# Patient Record
Sex: Female | Born: 1955 | Race: Black or African American | Hispanic: No | Marital: Single | Smoking: Current every day smoker
Health system: Southern US, Community
[De-identification: ages and names within clinical notes are randomized; demographics above are authoritative.]

## PROBLEM LIST (undated history)

## (undated) DIAGNOSIS — I639 Cerebral infarction, unspecified: Secondary | ICD-10-CM

## (undated) DIAGNOSIS — K219 Gastro-esophageal reflux disease without esophagitis: Secondary | ICD-10-CM

## (undated) DIAGNOSIS — I2510 Atherosclerotic heart disease of native coronary artery without angina pectoris: Secondary | ICD-10-CM

## (undated) DIAGNOSIS — I739 Peripheral vascular disease, unspecified: Secondary | ICD-10-CM

## (undated) DIAGNOSIS — I10 Essential (primary) hypertension: Secondary | ICD-10-CM

## (undated) DIAGNOSIS — H5440 Blindness, one eye, unspecified eye: Secondary | ICD-10-CM

## (undated) DIAGNOSIS — E785 Hyperlipidemia, unspecified: Secondary | ICD-10-CM

## (undated) HISTORY — DX: Peripheral vascular disease, unspecified: I73.9

## (undated) HISTORY — DX: Gastro-esophageal reflux disease without esophagitis: K21.9

## (undated) HISTORY — PX: CORONARY ANGIOPLASTY WITH STENT PLACEMENT: SHX49

## (undated) HISTORY — DX: Blindness, one eye, unspecified eye: H54.40

## (undated) HISTORY — DX: Hyperlipidemia, unspecified: E78.5

---

## 1995-04-26 DIAGNOSIS — I639 Cerebral infarction, unspecified: Secondary | ICD-10-CM

## 1995-04-26 HISTORY — DX: Cerebral infarction, unspecified: I63.9

## 2015-11-16 DIAGNOSIS — Z8673 Personal history of transient ischemic attack (TIA), and cerebral infarction without residual deficits: Secondary | ICD-10-CM | POA: Insufficient documentation

## 2015-11-16 DIAGNOSIS — D751 Secondary polycythemia: Secondary | ICD-10-CM | POA: Insufficient documentation

## 2015-11-16 DIAGNOSIS — I169 Hypertensive crisis, unspecified: Secondary | ICD-10-CM | POA: Insufficient documentation

## 2016-01-11 DIAGNOSIS — Z48812 Encounter for surgical aftercare following surgery on the circulatory system: Secondary | ICD-10-CM | POA: Insufficient documentation

## 2016-10-05 DIAGNOSIS — L600 Ingrowing nail: Secondary | ICD-10-CM | POA: Diagnosis not present

## 2017-01-26 DIAGNOSIS — I6523 Occlusion and stenosis of bilateral carotid arteries: Secondary | ICD-10-CM | POA: Insufficient documentation

## 2017-08-09 ENCOUNTER — Ambulatory Visit (INDEPENDENT_AMBULATORY_CARE_PROVIDER_SITE_OTHER): Payer: Self-pay | Admitting: Nurse Practitioner

## 2017-09-20 ENCOUNTER — Ambulatory Visit: Payer: Self-pay | Admitting: Family Medicine

## 2017-09-23 ENCOUNTER — Other Ambulatory Visit: Payer: Self-pay

## 2017-09-23 ENCOUNTER — Encounter (HOSPITAL_BASED_OUTPATIENT_CLINIC_OR_DEPARTMENT_OTHER): Payer: Self-pay | Admitting: Emergency Medicine

## 2017-09-23 ENCOUNTER — Emergency Department (HOSPITAL_BASED_OUTPATIENT_CLINIC_OR_DEPARTMENT_OTHER)
Admission: EM | Admit: 2017-09-23 | Discharge: 2017-09-23 | Disposition: A | Discharge status: Home / Self Care | Payer: Medicaid Other | Attending: Emergency Medicine | Admitting: Emergency Medicine

## 2017-09-23 DIAGNOSIS — Z7902 Long term (current) use of antithrombotics/antiplatelets: Secondary | ICD-10-CM | POA: Diagnosis not present

## 2017-09-23 DIAGNOSIS — F17200 Nicotine dependence, unspecified, uncomplicated: Secondary | ICD-10-CM | POA: Diagnosis not present

## 2017-09-23 DIAGNOSIS — I10 Essential (primary) hypertension: Secondary | ICD-10-CM | POA: Insufficient documentation

## 2017-09-23 DIAGNOSIS — D649 Anemia, unspecified: Secondary | ICD-10-CM | POA: Diagnosis not present

## 2017-09-23 DIAGNOSIS — R1013 Epigastric pain: Secondary | ICD-10-CM | POA: Insufficient documentation

## 2017-09-23 DIAGNOSIS — Z760 Encounter for issue of repeat prescription: Secondary | ICD-10-CM | POA: Insufficient documentation

## 2017-09-23 DIAGNOSIS — I2510 Atherosclerotic heart disease of native coronary artery without angina pectoris: Secondary | ICD-10-CM | POA: Insufficient documentation

## 2017-09-23 DIAGNOSIS — Z955 Presence of coronary angioplasty implant and graft: Secondary | ICD-10-CM | POA: Insufficient documentation

## 2017-09-23 DIAGNOSIS — Z79899 Other long term (current) drug therapy: Secondary | ICD-10-CM | POA: Insufficient documentation

## 2017-09-23 HISTORY — DX: Essential (primary) hypertension: I10

## 2017-09-23 HISTORY — DX: Atherosclerotic heart disease of native coronary artery without angina pectoris: I25.10

## 2017-09-23 HISTORY — DX: Cerebral infarction, unspecified: I63.9

## 2017-09-23 LAB — CBC
HCT: 28.6 % — ABNORMAL LOW (ref 36.0–46.0)
Hemoglobin: 9 g/dL — ABNORMAL LOW (ref 12.0–15.0)
MCH: 20.5 pg — AB (ref 26.0–34.0)
MCHC: 31.5 g/dL (ref 30.0–36.0)
MCV: 65 fL — ABNORMAL LOW (ref 78.0–100.0)
PLATELETS: 382 10*3/uL (ref 150–400)
RBC: 4.4 MIL/uL (ref 3.87–5.11)
RDW: 22.3 % — AB (ref 11.5–15.5)
WBC: 7.3 10*3/uL (ref 4.0–10.5)

## 2017-09-23 MED ORDER — ASPIRIN 81 MG PO CHEW: 81.0000 mg | CHEWABLE_TABLET | Freq: Every day | ORAL | 0 refills | Status: DC

## 2017-09-23 MED ORDER — AMLODIPINE BESYLATE 10 MG PO TABS: 10.0000 mg | ORAL_TABLET | Freq: Every day | ORAL | 0 refills | Status: DC

## 2017-09-23 MED ORDER — ASPIRIN 81 MG PO CHEW: 81.0000 mg | CHEWABLE_TABLET | Freq: Every day | ORAL | 0 refills | Status: AC

## 2017-09-23 MED ORDER — ATORVASTATIN CALCIUM 40 MG PO TABS: 40.0000 mg | ORAL_TABLET | Freq: Every day | ORAL | 0 refills | Status: DC

## 2017-09-23 MED ORDER — PANTOPRAZOLE SODIUM 20 MG PO TBEC: 20.0000 mg | DELAYED_RELEASE_TABLET | Freq: Every day | ORAL | 0 refills | Status: DC

## 2017-09-23 MED ORDER — CLOPIDOGREL BISULFATE 75 MG PO TABS: 75.0000 mg | ORAL_TABLET | Freq: Every day | ORAL | 0 refills | Status: AC

## 2017-09-23 MED ORDER — CLOPIDOGREL BISULFATE 75 MG PO TABS: 75.0000 mg | ORAL_TABLET | Freq: Every day | ORAL | 0 refills | Status: DC

## 2017-09-23 NOTE — ED Triage Notes (Signed)
Patient states that she needs a refill on a medications for her surgery that she had in "2017" - she reports that she had surgery on her nervous systems and her heart. The patient reports that she needs this medication because she hurts in her stomach when she eats, she also was told yesterday at the clinic that she has blood clots and she stopped taking that medications

## 2017-09-23 NOTE — ED Provider Notes (Signed)
[facility] EMERGENCY DEPARTMENT Provider Note   CSN: [id number] Arrival date & time: 09/23/17  1508     History   Chief Complaint Chief Complaint  Patient presents with  . Abdominal Pain    HPI Cindy Hess is a 62 y.o. female.  HPI  Patient is a 62 year old female with a history of coronary artery disease, hypertension, peripheral arterial disease presenting for need for medication refill.  Patient is also noting that she has some epigastric to right upper quadrant pain only after eating.  Patient is not presenting with this complaint at present.  Patient reports that she is moving from [facility] to [facility], and is seeking a new primary care provider.  Her current primary care provider is only able to refill her medications for Labetalol in the process of this move, and patient is unsure why.  At present, patient is denying any chest pain, shortness of breath, dizziness, lightheadedness, headache, visual disturbance, abdominal pain, nausea, vomiting, lower extremity edema or swelling, or increasing pain in the lower extremities.  Patient reports that she has been without clopidogrel for the past 3 months, but all of the medication she has been taking regularly patient denies any history of peptic ulcer disease, or any melena or hematochezia.  Past Medical History:  Diagnosis Date  . Coronary artery disease   . Hypertension   . Stroke [facility])     There are no active problems to display for this patient.   Past Surgical History:  Procedure Laterality Date  . CORONARY ANGIOPLASTY WITH STENT PLACEMENT       OB History   None      Home Medications    Prior to Admission medications   Medication Sig Start Date End Date Taking? Authorizing Provider  amLODipine (NORVASC) 10 MG tablet Take 10 mg by mouth daily.   Yes [provider]  atorvastatin (LIPITOR) 40 MG tablet Take 40 mg by mouth daily.   Yes [provider]    clopidogrel (PLAVIX) 75 MG tablet Take 75 mg by mouth daily.   Yes [provider]  labetalol (NORMODYNE) 200 MG tablet Take 200 mg by mouth 2 (two) times daily.   Yes [provider]  losartan (COZAAR) 100 MG tablet Take 100 mg by mouth daily.   Yes [provider]  pseudoephedrine-acetaminophen (TYLENOL SINUS) 30-500 MG TABS tablet Take 1 tablet by mouth every 4 (four) hours as needed.   Yes [provider]    Family History History reviewed. No pertinent family history.  Social History Social History   Tobacco Use  . Smoking status: Current Every Day Smoker  . Smokeless tobacco: Never Used  Substance Use Topics  . Alcohol use: Not Currently  . Drug use: Not Currently     Allergies   Lisinopril   Review of Systems Review of Systems  Constitutional: Negative for chills and fever.  HENT: Negative for congestion and rhinorrhea.   Respiratory: Negative for chest tightness and shortness of breath.   Cardiovascular: Negative for chest pain, palpitations and leg swelling.  Gastrointestinal: Positive for abdominal pain. Negative for blood in stool, nausea and vomiting.       Abdominal pain only after eating.  Genitourinary: Negative for dysuria.  Musculoskeletal: Negative for back pain, joint swelling and neck stiffness.  Skin: Negative for color change, rash and wound.  Neurological: Negative for dizziness, syncope and light-headedness.  All other systems reviewed and are negative.    Physical Exam Updated Vital  Signs BP (!) 180/90 (BP Location: Left Arm)   Pulse 88   Temp 98.6 F (37 C) (Oral)   Resp 18   Ht 5\' 2"  (1.575 m)   Wt 48.5 kg (107 lb)   SpO2 100%   BMI 19.57 kg/m   Physical Exam  Constitutional: She appears well-developed and well-nourished. No distress.  HENT:  Head: Normocephalic and atraumatic.  Mouth/Throat: Oropharynx is clear and moist.  Eyes: Pupils are equal, round, and reactive to light. Conjunctivae and  EOM are normal.  Neck: Normal range of motion. Neck supple.  Cardiovascular: Normal rate, regular rhythm, S1 normal and S2 normal.  Murmur heard. Pulses:      Radial pulses are 2+ on the right side, and 2+ on the left side.  No lower extremity edema. Loud blowing systolic murmur.  Pulmonary/Chest: Effort normal and breath sounds normal. She has no wheezes. She has no rales.  Abdominal: Soft. She exhibits no distension. There is no tenderness. There is no guarding.  Musculoskeletal: Normal range of motion. She exhibits no edema or deformity.  Scar on medial tibia of left leg.  Neurological: She is alert.  Cranial nerves grossly intact. Patient moves extremities symmetrically and with good coordination.  Skin: Skin is warm and dry. No rash noted. No erythema.  Psychiatric: She has a normal mood and affect. Her behavior is normal. Judgment and thought content normal.  Nursing note and vitals reviewed.    ED Treatments / Results  Labs (all labs ordered are listed, but only abnormal results are displayed) Labs Reviewed  CBC - Abnormal; Notable for the following components:      Result Value   Hemoglobin 9.0 (*)    HCT 28.6 (*)    MCV 65.0 (*)    MCH 20.5 (*)    RDW 22.3 (*)    All other components within normal limits    EKG None  Radiology No results found.  Procedures Procedures (including critical care time)  Medications Ordered in ED Medications - No data to display   Initial Impression / Assessment and Plan / ED Course  I have reviewed the triage vital signs and the nursing notes.  Pertinent labs & imaging results that were available during my care of the patient were reviewed by me and considered in my medical decision making (see chart for details).  Clinical Course as of Sep 24 1650  Sat Sep 23, 2017  1652 Checking CBC to make sure the patient has a normal platelet count.  Provided this, will refill patient's antiplatelet regimen.   [AM]    Clinical Course  User Index [AM] Elisha Ponder, PA-C    Patient is nontoxic-appearing in no acute distress.  Patient with no abdominal pain at present, therefore feel that this can be an outpatient workup.  Differential diagnosis for patient's postprandial epigastric to right upper quadrant pain includes biliary colic versus peptic ulcer disease versus gastritis.  Patient is in no acute distress, has no melena, do not suspect bleeding ulcer.  Given that patient needs to be on dual antiplatelet therapy due to severe peripheral vascular disease, will place patient on proton pump inhibitor.  Will check platelet count before re-prescribing clopidogrel.  Encouraged that patient should follow-up with the resources provided in order to find a primary care provider as soon as possible in [place] given her multiple chronic medical conditions.  Patient exhibits stable anemia.  Normal platelet count.  Therefore, re-dispensed short supply of Plavix.  Patient was also placed  on peptic ulcer prophylaxis while restarting aspirin with Protonix.  This was the recommendation of ED pharmacist, who states that has lesser interactions with Plavix.  Patient was given recommendations for primary care, instructed to follow-up as soon as possible.  Patient was given return precautions for any worsening abdominal pain, dark stools, dizziness, lightheadedness, chest pain, shortness breath.  Patient is in understanding and agrees with plan of care.  Patient reports that her murmur has previously been worked up in primary care.   Final Clinical Impressions(s) / ED Diagnoses   Final diagnoses:  Encounter for medication refill  Postprandial epigastric pain  Anemia, unspecified type    ED Discharge Orders        Ordered    amLODipine (NORVASC) 10 MG tablet  Daily     09/23/17 1729    atorvastatin (LIPITOR) 40 MG tablet  Daily     09/23/17 1729    clopidogrel (PLAVIX) 75 MG tablet  Daily,   Status:  Discontinued     09/23/17 1729     aspirin 81 MG chewable tablet  Daily,   Status:  Discontinued     09/23/17 1729    pantoprazole (PROTONIX) 20 MG tablet  Daily     09/23/17 1729    clopidogrel (PLAVIX) 75 MG tablet  Daily     09/23/17 1841    aspirin 81 MG chewable tablet  Daily     09/23/17 1841       Elisha PonderMurray, Saketh Daubert B, PA-C 09/24/17 0205    Rolan BuccoBelfi, Melanie, MD 09/24/17 1502

## 2017-09-23 NOTE — Discharge Instructions (Signed)
Please see the information and instructions below regarding your visit.  Your diagnoses today include:  1. Encounter for medication refill   2. Postprandial epigastric pain    Your lab work is reassuring today.  Your hemoglobin is per your baseline.  You do have anemia, which will be need to worked up in an outpatient basis.  Tests performed today include: See side panel of your discharge paperwork for testing performed today. Vital signs are listed at the bottom of these instructions.   Medications prescribed:    Take any prescribed medications only as prescribed, and any over the counter medications only as directed on the packaging.  I refilled your medications today.  The only new medication added today as Protonix.  He will take this daily in the morning.  This medication helps protect your stomach from the antiplatelet medications, particularly aspirin.  Home care instructions:  Please follow any educational materials contained in this packet.   Follow-up instructions: Please follow-up with your primary care provider in as soon as possible for further evaluation of your symptoms if they are not completely improved.  Please read the highlighted information in this packet on how to contact [facility] to set up a primary care provider.   Return instructions:  Please return to the Emergency Department if you experience worsening symptoms.  Please return to the emergency department for any chest pain, shortness of breath, feeling that you are going to pass out. Please return to the emergency department if you notice any dizziness or lightheadedness, or dark and tarry stools. Please return to the emergency department if you have any head trauma, as you are on antiplatelet therapy, and this can increase your risk of bleeding. Please return if you have any other emergent concerns.  To find a primary care or specialty doctor please call [phone] or [phone] to access "[facility] Find a Doctor Service."  You may also go on the [facility] website at [email or web address]  There are also multiple [facility], [facility] and [facility] practices throughout the [place] that are frequently accepting new patients. You may find a clinic that is close to your home and contact them.  [facility] [address] [place] [phone]  [facility] in [place] (also locations in [place] and [place]) - [address] [place] [phone]  [facility] - [address] [phone]    Your vital signs today were: BP (!) 147/93 (BP Location: Right Arm)    Pulse 86    Temp 98.6 F (37 C) (Oral)    Resp 18    Ht 5\' 2"  (1.575 m)    Wt 48.5 kg (107 lb)    SpO2 100%    BMI 19.57 kg/m  If your blood pressure (BP) was elevated on multiple readings during this visit above 130 for the top number or above 80 for the bottom number, please have this repeated by your primary care provider within one month. --------------  Thank you for allowing us to participate in your care today.

## 2017-09-25 ENCOUNTER — Telehealth (HOSPITAL_BASED_OUTPATIENT_CLINIC_OR_DEPARTMENT_OTHER): Payer: Self-pay | Admitting: Emergency Medicine

## 2017-09-25 NOTE — Care Management Note (Signed)
Case Management Note  CM consulted for no PCP.  CM noted pt goes to [facility] in [place] and has been established with them for several years.  Elnoria HowardUpdated Murray, PA.  No further CM needs noted at this time.

## 2017-12-20 ENCOUNTER — Encounter: Payer: Self-pay | Admitting: Nurse Practitioner

## 2017-12-20 ENCOUNTER — Ambulatory Visit: Payer: Medicaid Other | Attending: Nurse Practitioner | Admitting: Nurse Practitioner

## 2017-12-20 VITALS — BP 169/99 | HR 84 | Temp 99.1°F | Ht 62.0 in | Wt 103.8 lb

## 2017-12-20 DIAGNOSIS — I2510 Atherosclerotic heart disease of native coronary artery without angina pectoris: Secondary | ICD-10-CM | POA: Diagnosis not present

## 2017-12-20 DIAGNOSIS — Z1231 Encounter for screening mammogram for malignant neoplasm of breast: Secondary | ICD-10-CM | POA: Diagnosis not present

## 2017-12-20 DIAGNOSIS — E785 Hyperlipidemia, unspecified: Secondary | ICD-10-CM | POA: Insufficient documentation

## 2017-12-20 DIAGNOSIS — R739 Hyperglycemia, unspecified: Secondary | ICD-10-CM | POA: Insufficient documentation

## 2017-12-20 DIAGNOSIS — I739 Peripheral vascular disease, unspecified: Secondary | ICD-10-CM | POA: Insufficient documentation

## 2017-12-20 DIAGNOSIS — I10 Essential (primary) hypertension: Secondary | ICD-10-CM | POA: Diagnosis not present

## 2017-12-20 DIAGNOSIS — Z8673 Personal history of transient ischemic attack (TIA), and cerebral infarction without residual deficits: Secondary | ICD-10-CM | POA: Diagnosis not present

## 2017-12-20 DIAGNOSIS — Z955 Presence of coronary angioplasty implant and graft: Secondary | ICD-10-CM | POA: Diagnosis not present

## 2017-12-20 DIAGNOSIS — Z1211 Encounter for screening for malignant neoplasm of colon: Secondary | ICD-10-CM | POA: Diagnosis not present

## 2017-12-20 DIAGNOSIS — R7309 Other abnormal glucose: Secondary | ICD-10-CM | POA: Diagnosis not present

## 2017-12-20 DIAGNOSIS — K279 Peptic ulcer, site unspecified, unspecified as acute or chronic, without hemorrhage or perforation: Secondary | ICD-10-CM | POA: Diagnosis not present

## 2017-12-20 DIAGNOSIS — E782 Mixed hyperlipidemia: Secondary | ICD-10-CM | POA: Diagnosis not present

## 2017-12-20 DIAGNOSIS — K219 Gastro-esophageal reflux disease without esophagitis: Secondary | ICD-10-CM | POA: Insufficient documentation

## 2017-12-20 DIAGNOSIS — R634 Abnormal weight loss: Secondary | ICD-10-CM | POA: Insufficient documentation

## 2017-12-20 MED ORDER — CLOPIDOGREL BISULFATE 75 MG PO TABS: 75.0000 mg | ORAL_TABLET | Freq: Every day | ORAL | 3 refills | Status: AC

## 2017-12-20 MED ORDER — AMLODIPINE BESYLATE 10 MG PO TABS: 10.0000 mg | ORAL_TABLET | Freq: Every day | ORAL | 0 refills | Status: DC

## 2017-12-20 MED ORDER — PANTOPRAZOLE SODIUM 20 MG PO TBEC: 20.0000 mg | DELAYED_RELEASE_TABLET | Freq: Every day | ORAL | 2 refills | Status: AC

## 2017-12-20 MED ORDER — ATORVASTATIN CALCIUM 40 MG PO TABS: 40.0000 mg | ORAL_TABLET | Freq: Every day | ORAL | 1 refills | Status: DC

## 2017-12-20 MED ORDER — METOPROLOL SUCCINATE ER 50 MG PO TB24: 50.0000 mg | ORAL_TABLET | Freq: Every day | ORAL | 1 refills | Status: DC

## 2017-12-20 MED ORDER — ATORVASTATIN CALCIUM 40 MG PO TABS: 40.0000 mg | ORAL_TABLET | Freq: Every day | ORAL | 1 refills | Status: AC

## 2017-12-20 MED ORDER — PANTOPRAZOLE SODIUM 20 MG PO TBEC: 20.0000 mg | DELAYED_RELEASE_TABLET | Freq: Every day | ORAL | 2 refills | Status: DC

## 2017-12-20 MED ORDER — LOSARTAN POTASSIUM 100 MG PO TABS: 100.0000 mg | ORAL_TABLET | Freq: Every day | ORAL | 2 refills | Status: AC

## 2017-12-20 NOTE — Progress Notes (Signed)
Assessment & Plan:  Cindy Hess was seen today for establish care.  Diagnoses and all orders for this visit:  Essential hypertension -     CBC -     CMP14+EGFR -     amLODipine (NORVASC) 10 MG tablet; Take 1 tablet (10 mg total) by mouth daily. -     losartan (COZAAR) 100 MG tablet; Take 1 tablet (100 mg total) by mouth daily. -     metoprolol succinate (TOPROL-XL) 50 MG 24 hr tablet; Take 1 tablet (50 mg total) by mouth daily. Take with or immediately following a meal. Continue all antihypertensives as prescribed.  Remember to bring in your blood pressure log with you for your follow up appointment.  DASH/Mediterranean Diets are healthier choices for HTN.   Weight loss -     TSH  Breast cancer screening by mammogram -     MM 3D SCREEN BREAST BILATERAL; Future  Colon cancer screening -     Fecal occult blood, imunochemical([facility])  PUD (peptic ulcer disease) -     Ambulatory referral to Gastroenterology -     pantoprazole (PROTONIX) 20 MG tablet; Take 1 tablet (20 mg total) by mouth daily. INSTRUCTIONS: Avoid GERD Triggers: acidic, spicy or fried foods, caffeine, coffee, sodas,  alcohol and chocolate.   PAD (peripheral artery disease) (HCC) -     clopidogrel (PLAVIX) 75 MG tablet; Take 1 tablet (75 mg total) by mouth daily. -     Ambulatory referral to Vascular Surgery  Elevated glucose -     Hemoglobin A1c   Mixed hyperlipidemia -     Lipid panel -     atorvastatin (LIPITOR) 40 MG tablet; Take 1 tablet (40 mg total) by mouth daily. INSTRUCTIONS: Work on a low fat, heart healthy diet and participate in regular aerobic exercise program by working out at least 150 minutes per week; 5 days a week-30 minutes per day. Avoid red meat, fried foods. junk foods, sodas, sugary drinks, unhealthy snacking, alcohol and smoking.  Drink at least 48oz of water per day and monitor your carbohydrate intake daily.    Patient has been counseled on age-appropriate routine health  concerns for screening and prevention. These are reviewed and up-to-date. Referrals have been placed accordingly. Immunizations are up-to-date or declined.    Subjective:   Chief Complaint  Patient presents with  . Establish Care    Pt. stated she is here for athritis on her hand and legs. Pt. stated she would like to talk to PCP about gaining her appetite.    HPI Cindy Hess 62 y.o. female presents to office today to establish care. She moved here from [place] 5 months ago.  She has a history of PUD, HPL,  HTN, tobacco dependence, PAD( bilateral iliac artery stenting and endarterectomy with vein patch to the right femoral artery) She would like to establish care here in [place] with GI, vascular surgery and primary care. She has concerns about unintentional weight loss and poor appetite. This has been going on for several months now.    CHRONIC HYPERTENSION Disease Monitoring She has been out of her blood pressure medications.   Blood pressure range BP Readings from Last 3 Encounters:  12/20/17 (!) 169/99  09/23/17 (!) 161/97   Chest pain: no   Dyspnea: no   Claudication: no  Medication compliance: yes; taking amlodipine 10 mg and losartan 100 mg daily. Will add metoprolol succinate 50 mg daily.  Medication Side Effects  Lightheadedness: no   Urinary frequency:  no   Edema: no   Impotence: no  Preventitive Healthcare:  Exercise: no   Diet Pattern: diet: general  Salt Restriction:  no    Hyperlipidemia Patient presents for follow up to hyperlipidemia.  She is medication compliant. She has taken lovastatin in the past. She is diet compliant and denies skin xanthelasma or statin intolerance including myalgias.  Lab Results  Component Value Date   CHOL 188 12/20/2017   Lab Results  Component Value Date   HDL 45 12/20/2017   Lab Results  Component Value Date   LDLCALC 129 (H) 12/20/2017   Lab Results  Component Value Date   TRIG 69 12/20/2017   Lab Results    Component Value Date   CHOLHDL 4.2 12/20/2017   PUD Seen in the ED on 09-23-2017 with complaints of abdominal pain/post prandial epigastric to RUQ pain. It was felt her symptoms could be worked up as OP and DD included biliary colic vs PUD vs gastritis. She was started on a PPI. Today she reports increased improvement of symptoms. She will still need to be evaluated by GI.   Review of Systems  Constitutional: Positive for weight loss (persistent unintentional weight loss). Negative for fever and malaise/fatigue.  HENT: Negative.  Negative for nosebleeds.   Eyes: Negative.  Negative for blurred vision, double vision and photophobia.  Respiratory: Negative.  Negative for cough and shortness of breath.   Cardiovascular: Negative.  Negative for chest pain, palpitations and leg swelling.  Gastrointestinal: Positive for constipation. Negative for heartburn, nausea and vomiting.  Musculoskeletal: Positive for joint pain. Negative for myalgias.       OA  Neurological: Negative.  Negative for dizziness, focal weakness, seizures and headaches.  Psychiatric/Behavioral: Negative.  Negative for suicidal ideas.    Past Medical History:  Diagnosis Date  . Blind left eye   . Coronary artery disease   . GERD (gastroesophageal reflux disease)   . Hyperlipidemia   . Hypertension   . PAD (peripheral artery disease) ([place])   . Stroke [facility]) 1997    Past Surgical History:  Procedure Laterality Date  . CORONARY ANGIOPLASTY WITH STENT PLACEMENT     3 stent    Family History  Problem Relation Age of Onset  . Hypertension Mother     Social History Reviewed with no changes to be made today.   Outpatient Medications Prior to Visit  Medication Sig Dispense Refill  . pseudoephedrine-acetaminophen (TYLENOL SINUS) 30-500 MG TABS tablet Take 1 tablet by mouth every 4 (four) hours as needed.    Marland Kitchen amLODipine (NORVASC) 10 MG tablet Take 1 tablet (10 mg total) by mouth daily. 30 tablet 0  . atorvastatin  (LIPITOR) 40 MG tablet Take 1 tablet (40 mg total) by mouth daily. (Patient not taking: Reported on 12/20/2017) 30 tablet 0  . labetalol (NORMODYNE) 200 MG tablet Take 200 mg by mouth 2 (two) times daily.    Marland Kitchen losartan (COZAAR) 100 MG tablet Take 100 mg by mouth daily.    . pantoprazole (PROTONIX) 20 MG tablet Take 1 tablet (20 mg total) by mouth daily. 30 tablet 0   No facility-administered medications prior to visit.     Allergies  Allergen Reactions  . Lisinopril     Cough        Objective:    BP (!) 169/99 (BP Location: Right Arm, Patient Position: Sitting, Cuff Size: Normal)   Pulse 84   Temp 99.1 F (37.3 C) (Oral)   Ht _0  (1.575 m)  Wt 103 lb 12.8 oz (47.1 kg)   SpO2 100%   BMI 18.99 kg/m  Wt Readings from Last 3 Encounters:  12/20/17 103 lb 12.8 oz (47.1 kg)  09/23/17 107 lb (48.5 kg)    Physical Exam  Constitutional: She is oriented to person, place, and time. She appears well-developed and well-nourished. She is cooperative.  HENT:  Head: Normocephalic and atraumatic.  Eyes: EOM are normal.  Left eye amblyopia and blindness  Neck: Normal range of motion.  Cardiovascular: Normal rate, regular rhythm, normal heart sounds and intact distal pulses. Exam reveals no gallop and no friction rub.  No murmur heard. Pulmonary/Chest: Effort normal and breath sounds normal. No tachypnea. No respiratory distress. She has no decreased breath sounds. She has no wheezes. She has no rhonchi. She has no rales. She exhibits no tenderness.  Abdominal: Soft. Bowel sounds are normal.  Musculoskeletal: Normal range of motion. She exhibits no edema.  Neurological: She is alert and oriented to person, place, and time. Coordination normal.  Skin: Skin is warm and dry.  Psychiatric: She has a normal mood and affect. Her behavior is normal. Judgment and thought content normal.  Nursing note and vitals reviewed.      Patient has been counseled extensively about nutrition and  exercise as well as the importance of adherence with medications and regular follow-up. The patient was given clear instructions to go to ER or return to medical center if symptoms don't improve, worsen or new problems develop. The patient verbalized understanding.   Follow-up: Return in about 4 weeks (around 01/17/2018) for Appetite, OA, BP RECHECK.   Gildardo Pounds, FNP-BC [facility] [place], [place]   12/26/2017, 11:43 PM

## 2017-12-21 ENCOUNTER — Other Ambulatory Visit: Payer: Self-pay | Admitting: Nurse Practitioner

## 2017-12-21 DIAGNOSIS — R7309 Other abnormal glucose: Secondary | ICD-10-CM

## 2017-12-21 LAB — CMP14+EGFR
ALK PHOS: 146 IU/L — AB (ref 39–117)
ALT: 7 IU/L (ref 0–32)
AST: 14 IU/L (ref 0–40)
Albumin/Globulin Ratio: 1.3 (ref 1.2–2.2)
Albumin: 4.6 g/dL (ref 3.6–4.8)
BILIRUBIN TOTAL: 0.6 mg/dL (ref 0.0–1.2)
BUN/Creatinine Ratio: 17 (ref 12–28)
BUN: 12 mg/dL (ref 8–27)
CO2: 21 mmol/L (ref 20–29)
Calcium: 10.4 mg/dL — ABNORMAL HIGH (ref 8.7–10.3)
Chloride: 103 mmol/L (ref 96–106)
Creatinine, Ser: 0.71 mg/dL (ref 0.57–1.00)
GFR calc Af Amer: 106 mL/min/{1.73_m2} (ref >59)
GFR, EST NON AFRICAN AMERICAN: 92 mL/min/{1.73_m2} (ref >59)
GLUCOSE: 125 mg/dL — AB (ref 65–99)
Globulin, Total: 3.5 g/dL (ref 1.5–4.5)
POTASSIUM: 4.2 mmol/L (ref 3.5–5.2)
Sodium: 143 mmol/L (ref 134–144)
TOTAL PROTEIN: 8.1 g/dL (ref 6.0–8.5)

## 2017-12-21 LAB — LIPID PANEL
CHOLESTEROL TOTAL: 188 mg/dL (ref 100–199)
Chol/HDL Ratio: 4.2 ratio (ref 0.0–4.4)
HDL: 45 mg/dL (ref >39)
LDL CALC: 129 mg/dL — AB (ref 0–99)
TRIGLYCERIDES: 69 mg/dL (ref 0–149)
VLDL Cholesterol Cal: 14 mg/dL (ref 5–40)

## 2017-12-21 LAB — CBC
Hematocrit: 30.6 % — ABNORMAL LOW (ref 34.0–46.6)
Hemoglobin: 8.9 g/dL — ABNORMAL LOW (ref 11.1–15.9)
MCH: 20.3 pg — AB (ref 26.6–33.0)
MCHC: 29.1 g/dL — ABNORMAL LOW (ref 31.5–35.7)
MCV: 70 fL — ABNORMAL LOW (ref 79–97)
PLATELETS: 415 10*3/uL (ref 150–450)
RBC: 4.38 x10E6/uL (ref 3.77–5.28)
RDW: 20.1 % — ABNORMAL HIGH (ref 12.3–15.4)
WBC: 8.5 10*3/uL (ref 3.4–10.8)

## 2017-12-21 LAB — TSH: TSH: 0.841 u[IU]/mL (ref 0.450–4.500)

## 2017-12-22 ENCOUNTER — Other Ambulatory Visit: Payer: Self-pay

## 2017-12-22 ENCOUNTER — Telehealth: Payer: Self-pay

## 2017-12-22 DIAGNOSIS — I739 Peripheral vascular disease, unspecified: Secondary | ICD-10-CM

## 2017-12-22 NOTE — Telephone Encounter (Signed)
CMA spoke to patient to inform on lab results. Patient is aware Gastroenterology will call her to set up an appt.   Patient verified DOB. Patient understood.

## 2017-12-22 NOTE — Telephone Encounter (Signed)
-----   Message from Claiborne RiggZelda W Fleming, NP sent at 12/21/2017 11:52 PM EDT ----- Labs continue to show anemia. Please make sure you follow up with gastroenterology when they call you to set up appointment. It is very important that we try to evaluate why your hgb levels are low.

## 2017-12-23 LAB — SPECIMEN STATUS REPORT

## 2017-12-23 LAB — HEMOGLOBIN A1C
Est. average glucose Bld gHb Est-mCnc: 120 mg/dL
HEMOGLOBIN A1C: 5.8 % — AB (ref 4.8–5.6)

## 2017-12-26 ENCOUNTER — Encounter: Payer: Self-pay | Admitting: Nurse Practitioner

## 2017-12-26 MED ORDER — ASPIRIN EC 81 MG PO TBEC: 81.0000 mg | DELAYED_RELEASE_TABLET | Freq: Every day | ORAL | 1 refills | Status: DC

## 2017-12-29 LAB — FECAL OCCULT BLOOD, IMMUNOCHEMICAL: Fecal Occult Bld: NEGATIVE

## 2018-01-01 ENCOUNTER — Encounter: Payer: Self-pay | Admitting: Gastroenterology

## 2018-01-02 ENCOUNTER — Telehealth: Payer: Self-pay

## 2018-01-02 NOTE — Telephone Encounter (Signed)
-----   Message from Claiborne Rigg, NP sent at 12/31/2017  9:45 PM EDT ----- Fecal occult test was negative.

## 2018-01-02 NOTE — Telephone Encounter (Signed)
CMA spoke to patient to inform on results.  Patient verified DOB. Patient understood.  

## 2018-01-17 ENCOUNTER — Ambulatory Visit: Payer: Medicaid Other | Attending: Nurse Practitioner | Admitting: Nurse Practitioner

## 2018-01-17 ENCOUNTER — Encounter: Payer: Self-pay | Admitting: Nurse Practitioner

## 2018-01-17 VITALS — BP 162/84 | HR 65 | Temp 99.0°F | Ht 62.0 in | Wt 105.0 lb

## 2018-01-17 DIAGNOSIS — F17200 Nicotine dependence, unspecified, uncomplicated: Secondary | ICD-10-CM | POA: Diagnosis not present

## 2018-01-17 DIAGNOSIS — Z8249 Family history of ischemic heart disease and other diseases of the circulatory system: Secondary | ICD-10-CM | POA: Insufficient documentation

## 2018-01-17 DIAGNOSIS — I10 Essential (primary) hypertension: Secondary | ICD-10-CM | POA: Diagnosis not present

## 2018-01-17 DIAGNOSIS — Z8673 Personal history of transient ischemic attack (TIA), and cerebral infarction without residual deficits: Secondary | ICD-10-CM | POA: Insufficient documentation

## 2018-01-17 DIAGNOSIS — H5462 Unqualified visual loss, left eye, normal vision right eye: Secondary | ICD-10-CM | POA: Diagnosis not present

## 2018-01-17 DIAGNOSIS — Z79899 Other long term (current) drug therapy: Secondary | ICD-10-CM | POA: Insufficient documentation

## 2018-01-17 DIAGNOSIS — K219 Gastro-esophageal reflux disease without esophagitis: Secondary | ICD-10-CM | POA: Insufficient documentation

## 2018-01-17 DIAGNOSIS — I739 Peripheral vascular disease, unspecified: Secondary | ICD-10-CM | POA: Insufficient documentation

## 2018-01-17 DIAGNOSIS — I2510 Atherosclerotic heart disease of native coronary artery without angina pectoris: Secondary | ICD-10-CM | POA: Insufficient documentation

## 2018-01-17 DIAGNOSIS — Z7902 Long term (current) use of antithrombotics/antiplatelets: Secondary | ICD-10-CM | POA: Insufficient documentation

## 2018-01-17 DIAGNOSIS — Z955 Presence of coronary angioplasty implant and graft: Secondary | ICD-10-CM | POA: Insufficient documentation

## 2018-01-17 DIAGNOSIS — Z888 Allergy status to other drugs, medicaments and biological substances status: Secondary | ICD-10-CM | POA: Insufficient documentation

## 2018-01-17 DIAGNOSIS — E785 Hyperlipidemia, unspecified: Secondary | ICD-10-CM | POA: Diagnosis not present

## 2018-01-17 NOTE — Progress Notes (Signed)
Assessment & Plan:  Cindy Hess was seen today for follow-up.  Diagnoses and all orders for this visit:  Essential hypertension Elevated. F/U 4 weeks for BP recheck.   Patient has been counseled on age-appropriate routine health concerns for screening and prevention. These are reviewed and up-to-date. Referrals have been placed accordingly. Immunizations are up-to-date or declined.    Subjective:   Chief Complaint  Patient presents with  . Follow-up    Pt. is here for follow-up. Pt. stated she is slowly geting her apeptite back. Pt. is here for blood pressure check.    HPI Cindy Hess 62 y.o. female presents to office today for BP recheck. She is not taking her blood pressure medication as prescribed.    Essential Hypertension Blood pressure is not well controlled. She is monitoring her blood pressure at home and reports readings are "high". She is taking some blood pressure medications in the morning and some in the evenings. I have instructed her to take her blood pressure medications in the mornings. Denies chest pain, shortness of breath, palpitations, lightheadedness, dizziness, headaches or BLE edema. Blood pressure is elevated today however she has not taken her losartan yet. She continues to smoke. Will  BP Readings from Last 3 Encounters:  01/17/18 (!) 162/84  12/20/17 (!) 169/99  09/23/17 (!) 161/97    Review of Systems  Constitutional: Negative for fever, malaise/fatigue and weight loss.  HENT: Negative.  Negative for nosebleeds.   Eyes: Negative.  Negative for blurred vision, double vision and photophobia.  Respiratory: Negative.  Negative for cough and shortness of breath.   Cardiovascular: Negative.  Negative for chest pain, palpitations and leg swelling.  Gastrointestinal: Negative.  Negative for heartburn, nausea and vomiting.  Musculoskeletal: Negative.  Negative for myalgias.  Neurological: Negative.  Negative for dizziness, focal weakness, seizures and  headaches.  Psychiatric/Behavioral: Negative.  Negative for suicidal ideas.    Past Medical History:  Diagnosis Date  . Blind left eye   . Coronary artery disease   . GERD (gastroesophageal reflux disease)   . Hyperlipidemia   . Hypertension   . PAD (peripheral artery disease) (HCC)   . Stroke [facility]) 1997    Past Surgical History:  Procedure Laterality Date  . CORONARY ANGIOPLASTY WITH STENT PLACEMENT     3 stent    Family History  Problem Relation Age of Onset  . Hypertension Mother     Social History Reviewed with no changes to be made today.   Outpatient Medications Prior to Visit  Medication Sig Dispense Refill  . amLODipine (NORVASC) 10 MG tablet Take 1 tablet (10 mg total) by mouth daily. 90 tablet 0  . atorvastatin (LIPITOR) 40 MG tablet Take 1 tablet (40 mg total) by mouth daily. 90 tablet 1  . clopidogrel (PLAVIX) 75 MG tablet Take 1 tablet (75 mg total) by mouth daily. 90 tablet 3  . losartan (COZAAR) 100 MG tablet Take 1 tablet (100 mg total) by mouth daily. 90 tablet 2  . metoprolol succinate (TOPROL-XL) 50 MG 24 hr tablet Take 1 tablet (50 mg total) by mouth daily. Take with or immediately following a meal. 30 tablet 1  . pantoprazole (PROTONIX) 20 MG tablet Take 1 tablet (20 mg total) by mouth daily. 90 tablet 2  . aspirin EC 81 MG tablet Take 1 tablet (81 mg total) by mouth daily. (Patient not taking: Reported on 01/17/2018) 90 tablet 1  . pseudoephedrine-acetaminophen (TYLENOL SINUS) 30-500 MG TABS tablet Take 1 tablet by mouth every  4 (four) hours as needed.     No facility-administered medications prior to visit.     Allergies  Allergen Reactions  . Lisinopril     Cough        Objective:    BP (!) 162/84 (BP Location: Right Arm, Patient Position: Sitting, Cuff Size: Normal)   Pulse 65   Temp 99 F (37.2 C) (Oral)   Ht 5\' 2"  (1.575 m)   Wt 105 lb (47.6 kg)   SpO2 100%   BMI 19.20 kg/m  Wt Readings from Last 3 Encounters:  01/17/18 105 lb  (47.6 kg)  12/20/17 103 lb 12.8 oz (47.1 kg)  09/23/17 107 lb (48.5 kg)    Physical Exam  Constitutional: She is oriented to person, place, and time. She appears well-developed and well-nourished. She is cooperative.  HENT:  Head: Normocephalic and atraumatic.  Eyes: EOM are normal.  Neck: Normal range of motion.  Cardiovascular: Normal rate, regular rhythm and normal heart sounds. Exam reveals no gallop and no friction rub.  No murmur heard. Pulmonary/Chest: Effort normal and breath sounds normal. No tachypnea. No respiratory distress. She has no decreased breath sounds. She has no wheezes. She has no rhonchi. She has no rales. She exhibits no tenderness.  Abdominal: Bowel sounds are normal.  Musculoskeletal: Normal range of motion. She exhibits no edema.  Neurological: She is alert and oriented to person, place, and time. Coordination normal.  Skin: Skin is warm and dry.  Psychiatric: She has a normal mood and affect. Her behavior is normal. Judgment and thought content normal.  Nursing note and vitals reviewed.      Patient has been counseled extensively about nutrition and exercise as well as the importance of adherence with medications and regular follow-up. The patient was given clear instructions to go to ER or return to medical center if symptoms don't improve, worsen or new problems develop. The patient verbalized understanding.   Follow-up: Return in about 4 weeks (around 02/14/2018) for PAP and BP recheck .   Claiborne Rigg, FNP-BC [facility] [place], [place] [phone]   01/17/2018, 2:02 PM

## 2018-01-22 ENCOUNTER — Ambulatory Visit: Payer: Medicaid Other

## 2018-02-07 NOTE — Progress Notes (Signed)
Referring Provider: Gildardo Pounds, NP Primary Care Physician:  Gildardo Pounds, NP   Reason for Consultation:  Anemia   IMPRESSION:  Microscopic anemia    - history of endoscopy to evaluate anemia in the 1990s Post-prandial epigastric and right upper quadrant pain     - improved with PPI therapy and discontinuation of NSAIDs FOBT negative Daily Plavix for CAD requiring stent placement Colonoscopy in the 1990s for anemia, no colon cancer screening  Unexplained anemia with recent epigastric and RUQ pain in the setting of NSAID use. Suspected NSAID related PUD, esophagitis, non-erosive reflux disease, H pylori, or gastritis. Must also consider symptomatic gallbladder disease. EGD recommended to evaluate her symptoms. Colonoscopy recommended to evaluate for occult GI blood loss and for colon cancer screening while she is off Plavix.   PLAN: EGD with duodenal biopsies and Colonoscopy if a Plavix washout is approved by her prescribing physician If endoscopy is negative, proceed with laboratory evaluation of anemia +/- referral to hematology  I consented the patient at the bedside today discussing the risks, benefits, and alternatives to endoscopic evaluation. In particular, we discussed the risks that include, but are not limited to, reaction to medication, cardiopulmonary compromise, bleeding requiring blood transfusion, aspiration resulting in pneumonia, perforation requiring surgery, and even death. We reviewed the risk of missed lesion including polyps or even cancer. The patient acknowledges these risks and asks that we proceed.  We also discussed risks and benefits to a Plavix washout prior to proceeding with colonoscopy to for allow for the removal of polyps at the time of his endoscopy.  I discussed the low, but real, risk of a cardiovascular event such as heart attack, stroke, or embolism/thrombosis while off Plavix.    HPI: Cindy Hess is a 62 y.o. female with a history of  coronary artery disease, hypertension, peripheral arterial disease. She is on chronic Plavix after cardiac stents were placed in 2017.  She recently saw her doctor reporting the acute onset of non-radiating, severe, post-prandial epigastric and right upper quadrant pain that has improved with PPI therapy. She had associated severe anorexia and occassional indigestion. These have improved with PPI therapy and a 15 pound weight loss. Some psychosocial stress with recently moving back to [place].   Previously used NSAIDs. But, she was told to stop taking these with her abdominal pain. No menses. No epistaxis, hematuria, hemoptysis. GI ROS is otherwise negative. No other associated features. No identified exacerbating or relieving features.   No overt GI blood loss. No melena, hematochezia, bright red blood per rectum. No epistaxis, vaginal bleeding, hemoptysis, or hematuria.   On Plavix since 2017 since her cardiac stents were placed. Off Plavix for three months when moving to [facility] because she couldn't find a doctor to refill her script. No events occurred during that time.   Labs from December 20, 2017 show a normal CMP except for a glucose of 125, calcium 10.4, and alk phos 146.  Her hemoglobin is 8.9 with an MCV of 70 and an RDW of 20.1.  Labs from September 23, 2017 show a hemoglobin of 9. Negative FOBT 12/20/17. I am unable to locate labs from prior to 2019.  She had endoscopy many, many years ago for anemia. She suspects prior to 1990 at [facility]. No source identified.    Past Medical History:  Diagnosis Date  . Blind left eye   . Coronary artery disease   . GERD (gastroesophageal reflux disease)   . Hyperlipidemia   .  Hypertension   . PAD (peripheral artery disease) ([place])   . Stroke [facility]) 1997    Past Surgical History:  Procedure Laterality Date  . CORONARY ANGIOPLASTY WITH STENT PLACEMENT     3 stent    Prior to Admission medications   Medication Sig Start Date End  Date Taking? Authorizing Provider  amLODipine (NORVASC) 10 MG tablet Take 1 tablet (10 mg total) by mouth daily. 12/20/17 01/19/18  Gildardo Pounds, NP  aspirin EC 81 MG tablet Take 1 tablet (81 mg total) by mouth daily. Patient not taking: Reported on 01/17/2018 12/26/17   Gildardo Pounds, NP  atorvastatin (LIPITOR) 40 MG tablet Take 1 tablet (40 mg total) by mouth daily. 12/20/17 03/20/18  Gildardo Pounds, NP  clopidogrel (PLAVIX) 75 MG tablet Take 1 tablet (75 mg total) by mouth daily. 12/20/17   Gildardo Pounds, NP  losartan (COZAAR) 100 MG tablet Take 1 tablet (100 mg total) by mouth daily. 12/20/17 03/20/18  Gildardo Pounds, NP  metoprolol succinate (TOPROL-XL) 50 MG 24 hr tablet Take 1 tablet (50 mg total) by mouth daily. Take with or immediately following a meal. 12/20/17 01/19/18  Gildardo Pounds, NP  pantoprazole (PROTONIX) 20 MG tablet Take 1 tablet (20 mg total) by mouth daily. 12/20/17 03/20/18  Gildardo Pounds, NP  pseudoephedrine-acetaminophen (TYLENOL SINUS) 30-500 MG TABS tablet Take 1 tablet by mouth every 4 (four) hours as needed.    [provider]    Current Outpatient Medications  Medication Sig Dispense Refill  . amLODipine (NORVASC) 10 MG tablet Take 1 tablet (10 mg total) by mouth daily. 90 tablet 0  . aspirin EC 81 MG tablet Take 1 tablet (81 mg total) by mouth daily. (Patient not taking: Reported on 01/17/2018) 90 tablet 1  . atorvastatin (LIPITOR) 40 MG tablet Take 1 tablet (40 mg total) by mouth daily. 90 tablet 1  . clopidogrel (PLAVIX) 75 MG tablet Take 1 tablet (75 mg total) by mouth daily. 90 tablet 3  . losartan (COZAAR) 100 MG tablet Take 1 tablet (100 mg total) by mouth daily. 90 tablet 2  . metoprolol succinate (TOPROL-XL) 50 MG 24 hr tablet Take 1 tablet (50 mg total) by mouth daily. Take with or immediately following a meal. 30 tablet 1  . pantoprazole (PROTONIX) 20 MG tablet Take 1 tablet (20 mg total) by mouth daily. 90 tablet 2  .  pseudoephedrine-acetaminophen (TYLENOL SINUS) 30-500 MG TABS tablet Take 1 tablet by mouth every 4 (four) hours as needed.     No current facility-administered medications for this visit.     Allergies as of 02/08/2018 - Review Complete 01/17/2018  Allergen Reaction Noted  . Lisinopril  09/23/2017    Family History  Problem Relation Age of Onset  . Hypertension Mother     Social History   Socioeconomic History  . Marital status: Single    Spouse name: Not on file  . Number of children: Not on file  . Years of education: Not on file  . Highest education level: Not on file  Occupational History  . Not on file  Social Needs  . Financial resource strain: Not on file  . Food insecurity:    Worry: Not on file    Inability: Not on file  . Transportation needs:    Medical: Not on file    Non-medical: Not on file  Tobacco Use  . Smoking status: Current Every Day Smoker  . Smokeless tobacco: Never Used  Substance and Sexual Activity  . Alcohol use: Yes    Comment: socially   . Drug use: Not Currently  . Sexual activity: Not Currently  Lifestyle  . Physical activity:    Days per week: Not on file    Minutes per session: Not on file  . Stress: Not on file  Relationships  . Social connections:    Talks on phone: Not on file    Gets together: Not on file    Attends religious service: Not on file    Active member of club or organization: Not on file    Attends meetings of clubs or organizations: Not on file    Relationship status: Not on file  . Intimate partner violence:    Fear of current or ex partner: Not on file    Emotionally abused: Not on file    Physically abused: Not on file    Forced sexual activity: Not on file  Other Topics Concern  . Not on file  Social History Narrative  . Not on file    Review of Systems: 12 system ROS is negative except as noted above.   Physical Exam: Vital signs reviewed.  General:   Alert, well-nourished, pleasant and  cooperative in NAD. Slightly anxious. Head:  Normocephalic and atraumatic. Eyes:  Sclera clear, no icterus.   Conjunctiva pink. Mouth:  No deformity or lesions.   Neck:  Supple; no thyromegaly. Lungs:  Clear throughout to auscultation.   No wheezes.  Heart:  Regular rate and rhythm; no murmurs Abdomen:  Soft, thin, nontender, normal bowel sounds. No rebound or guarding. No hepatosplenomegaly Rectal:  Deferred  Msk:  Symmetrical without gross deformities. Extremities:  No gross deformities or edema. Neurologic:  Alert and  oriented x4;  grossly nonfocal Skin:  No rash or bruise. Psych:  Alert and cooperative. Normal mood and affect.   Antoinette Borgwardt L. Tarri Glenn Md, MPH [facility] 02/07/2018, 11:49 AM

## 2018-02-08 ENCOUNTER — Ambulatory Visit (INDEPENDENT_AMBULATORY_CARE_PROVIDER_SITE_OTHER): Payer: Medicaid Other | Admitting: Gastroenterology

## 2018-02-08 ENCOUNTER — Encounter (INDEPENDENT_AMBULATORY_CARE_PROVIDER_SITE_OTHER): Payer: Self-pay

## 2018-02-08 ENCOUNTER — Encounter: Payer: Self-pay | Admitting: Gastroenterology

## 2018-02-08 ENCOUNTER — Telehealth: Payer: Self-pay | Admitting: Emergency Medicine

## 2018-02-08 VITALS — BP 168/78 | HR 66 | Ht 65.0 in | Wt 106.0 lb

## 2018-02-08 DIAGNOSIS — R1013 Epigastric pain: Secondary | ICD-10-CM

## 2018-02-08 DIAGNOSIS — R1011 Right upper quadrant pain: Secondary | ICD-10-CM

## 2018-02-08 DIAGNOSIS — D509 Iron deficiency anemia, unspecified: Secondary | ICD-10-CM | POA: Diagnosis not present

## 2018-02-08 NOTE — Patient Instructions (Signed)

## 2018-02-08 NOTE — Telephone Encounter (Signed)
   Cindy Hess December 30, 1955 [id number]  Dear Dr. Meredeth Ide:  We have scheduled the above named patient for a(n) endoscopy and colonoscopy procedure. Our records show that (s)he is on anticoagulation therapy.  Please advise as to whether the patient may come off their therapy of Plavix  5 days prior to their procedure which is scheduled for 02-19-18.  Please route your response to Deneise Lever, CMA or fax response to [phone].  Sincerely,    [facility]

## 2018-02-11 NOTE — Telephone Encounter (Signed)
She can stop her plavix 72 hours prior to the procedure and resume 48-72 hours after the procedure if no bleeding complications.

## 2018-02-12 NOTE — Telephone Encounter (Signed)
Thanks for your input. I will have limited therapeutic options if we are unable to hold the Plavix for 5 days prior to endoscopy. I wanted to confirm that this is not an option. Thank you.

## 2018-02-13 ENCOUNTER — Ambulatory Visit (INDEPENDENT_AMBULATORY_CARE_PROVIDER_SITE_OTHER): Payer: Medicaid Other | Admitting: Vascular Surgery

## 2018-02-13 ENCOUNTER — Encounter (HOSPITAL_COMMUNITY): Payer: Medicaid Other

## 2018-02-13 ENCOUNTER — Encounter: Payer: Self-pay | Admitting: Vascular Surgery

## 2018-02-13 DIAGNOSIS — I739 Peripheral vascular disease, unspecified: Secondary | ICD-10-CM | POA: Diagnosis not present

## 2018-02-13 NOTE — Progress Notes (Signed)
Patient name: Cindy Hess MRN: [id number] DOB: Jul 25, 1955 Sex: female  REASON FOR CONSULT: PAD  HPI: Cindy Hess is a 62 y.o. female, with history of hypertension hyperlipidemia tobacco abuse, PAD that presents for new provider evaluation of her PAD.  Turns out the patient has had extensive vascular surgery procedures done at [facility] in [facility].  Patient states she recently moved here and now wants to establish care with a vascular surgeon in [place].  In reviewing her records and care everywhere appears that in 2017 she had a right common femoral endarterectomy as well as retrograde right common and external iliac stent and also had a left common and external iliac stent.  Patient reports she is been followed by Dr. Janee Morn and he was also following her for carotid disease in addition to her peripheral vascular disease.  On evaluation today she states she had some increased numbness and pain in both of her feet.  Her last follow-up with her vascular surgeon was in January of this year according to the patient.  Past Medical History:  Diagnosis Date  . Blind left eye   . Coronary artery disease   . GERD (gastroesophageal reflux disease)   . Hyperlipidemia   . Hypertension   . PAD (peripheral artery disease) (HCC)   . Stroke [facility]) 1997    Past Surgical History:  Procedure Laterality Date  . CORONARY ANGIOPLASTY WITH STENT PLACEMENT     3 stent    Family History  Problem Relation Age of Onset  . Hypertension Mother     SOCIAL HISTORY: Social History   Socioeconomic History  . Marital status: Single    Spouse name: Not on file  . Number of children: Not on file  . Years of education: Not on file  . Highest education level: Not on file  Occupational History  . Not on file  Social Needs  . Financial resource strain: Not on file  . Food insecurity:    Worry: Not on file    Inability: Not on file  . Transportation needs:    Medical: Not on file   Non-medical: Not on file  Tobacco Use  . Smoking status: Current Every Day Smoker    Packs/day: 0.25    Years: 3.00    Pack years: 0.75    Types: Cigarettes  . Smokeless tobacco: Never Used  Substance and Sexual Activity  . Alcohol use: Yes    Comment: socially   . Drug use: Not Currently  . Sexual activity: Not Currently  Lifestyle  . Physical activity:    Days per week: Not on file    Minutes per session: Not on file  . Stress: Not on file  Relationships  . Social connections:    Talks on phone: Not on file    Gets together: Not on file    Attends religious service: Not on file    Active member of club or organization: Not on file    Attends meetings of clubs or organizations: Not on file    Relationship status: Not on file  . Intimate partner violence:    Fear of current or ex partner: Not on file    Emotionally abused: Not on file    Physically abused: Not on file    Forced sexual activity: Not on file  Other Topics Concern  . Not on file  Social History Narrative  . Not on file    Allergies  Allergen Reactions  . Lisinopril  Cough   . Lovastatin Cough    Current Outpatient Medications  Medication Sig Dispense Refill  . amLODipine (NORVASC) 10 MG tablet Take 1 tablet (10 mg total) by mouth daily. 90 tablet 0  . atorvastatin (LIPITOR) 40 MG tablet Take 1 tablet (40 mg total) by mouth daily. 90 tablet 1  . calcium-vitamin D 250-100 MG-UNIT tablet Take by mouth.    . clopidogrel (PLAVIX) 75 MG tablet Take 1 tablet (75 mg total) by mouth daily. 90 tablet 3  . losartan (COZAAR) 100 MG tablet Take 1 tablet (100 mg total) by mouth daily. 90 tablet 2  . metoprolol succinate (TOPROL-XL) 50 MG 24 hr tablet Take 1 tablet (50 mg total) by mouth daily. Take with or immediately following a meal. 30 tablet 1  . pantoprazole (PROTONIX) 20 MG tablet Take 1 tablet (20 mg total) by mouth daily. 90 tablet 2   No current facility-administered medications for this visit.      REVIEW OF SYSTEMS:  [X]  denotes positive finding, [ ]  denotes negative finding Cardiac  Comments:  Chest pain or chest pressure:    Shortness of breath upon exertion:    Short of breath when lying flat:    Irregular heart rhythm:        Vascular    Pain in calf, thigh, or hip brought on by ambulation: x   Pain in feet at night that wakes you up from your sleep:  x   Blood clot in your veins:    Leg swelling:         Pulmonary    Oxygen at home:    Productive cough:     Wheezing:         Neurologic    Sudden weakness in arms or legs:     Sudden numbness in arms or legs:     Sudden onset of difficulty speaking or slurred speech:    Temporary loss of vision in one eye:     Problems with dizziness:         Gastrointestinal    Blood in stool:     Vomited blood:         Genitourinary    Burning when urinating:     Blood in urine:        Psychiatric    Major depression:         Hematologic    Bleeding problems:    Problems with blood clotting too easily:        Skin    Rashes or ulcers:        Constitutional    Fever or chills:      PHYSICAL EXAM: Vitals:   02/13/18 1407  BP: (!) 148/84  Pulse: 66  Resp: 16  Temp: 98.8 F (37.1 C)  TempSrc: Oral  SpO2: 100%  Weight: 48.5 kg  Height: 5\' 2"  (1.575 m)    GENERAL: The patient is a well-nourished female, in no acute distress. The vital signs are documented above. CARDIAC: There is a regular rate and rhythm.  VASCULAR:  Well healed right vertical groin incision 2+ palpable bilateral femoral pulses 1+ palpable bilateral DP pulses bilateral lower extremities No tissue loss in either lower extremity PULMONARY: There is good air exchange bilaterally without wheezing or rales. ABDOMEN: Soft and non-tender with normal pitched bowel sounds.  MUSCULOSKELETAL: There are no major deformities or cyanosis. NEUROLOGIC: No focal weakness or paresthesias are detected. SKIN: There are no ulcers or rashes  noted. PSYCHIATRIC: The  patient has a normal affect.  DATA:   No imaging to review  Assessment/Plan:  61 year old female with history of severe PAD that has undergone bilateral common and external iliac stenting as well as a right common femoral endarterectomy at [facility].  She presents today to establish care here with a vascular surgeon in [place].  On exam she has palpable dorsalis pedis pulses with no evidence of tissue loss and some subjective numbness in both feet.  Her previous right groin incision is well-healed.  She has palpable bilateral femoral pulses as well.  Given that she is new to our practice I recommended bilateral aortoiliac duplex/ABI's to evaluate her iliac stents as well as a carotid duplex since she was under surveillance for carotid disease.  I will contact the patient with the results of her studies and if we need to see her sooner for an intervention we can.  Otherwise recommended a one-year follow-up.  Her last ABI's were 1.04 on the right and 0.84 on the left per chart review.    Cephus Shelling, MD [facility] [place] Office: [phone] Pager: [phone]    Cephus Shelling

## 2018-02-14 ENCOUNTER — Ambulatory Visit: Payer: Medicaid Other | Attending: Nurse Practitioner | Admitting: Nurse Practitioner

## 2018-02-14 ENCOUNTER — Other Ambulatory Visit: Payer: Self-pay

## 2018-02-14 ENCOUNTER — Other Ambulatory Visit (HOSPITAL_COMMUNITY)
Admission: RE | Admit: 2018-02-14 | Discharge: 2018-02-14 | Disposition: A | Discharge status: Home / Self Care | Payer: Medicaid Other | Source: Ambulatory Visit | Attending: Nurse Practitioner | Admitting: Nurse Practitioner

## 2018-02-14 ENCOUNTER — Encounter: Payer: Self-pay | Admitting: Nurse Practitioner

## 2018-02-14 VITALS — BP 143/82 | HR 65 | Temp 98.6°F | Ht 62.0 in | Wt 106.4 lb

## 2018-02-14 DIAGNOSIS — Z01419 Encounter for gynecological examination (general) (routine) without abnormal findings: Secondary | ICD-10-CM | POA: Diagnosis not present

## 2018-02-14 DIAGNOSIS — Z888 Allergy status to other drugs, medicaments and biological substances status: Secondary | ICD-10-CM | POA: Insufficient documentation

## 2018-02-14 DIAGNOSIS — Z124 Encounter for screening for malignant neoplasm of cervix: Secondary | ICD-10-CM | POA: Diagnosis not present

## 2018-02-14 DIAGNOSIS — I10 Essential (primary) hypertension: Secondary | ICD-10-CM

## 2018-02-14 DIAGNOSIS — Z79899 Other long term (current) drug therapy: Secondary | ICD-10-CM | POA: Diagnosis not present

## 2018-02-14 DIAGNOSIS — Z8673 Personal history of transient ischemic attack (TIA), and cerebral infarction without residual deficits: Secondary | ICD-10-CM | POA: Insufficient documentation

## 2018-02-14 DIAGNOSIS — K219 Gastro-esophageal reflux disease without esophagitis: Secondary | ICD-10-CM | POA: Diagnosis not present

## 2018-02-14 DIAGNOSIS — I739 Peripheral vascular disease, unspecified: Secondary | ICD-10-CM

## 2018-02-14 DIAGNOSIS — Z8249 Family history of ischemic heart disease and other diseases of the circulatory system: Secondary | ICD-10-CM | POA: Diagnosis not present

## 2018-02-14 DIAGNOSIS — E785 Hyperlipidemia, unspecified: Secondary | ICD-10-CM | POA: Insufficient documentation

## 2018-02-14 DIAGNOSIS — Z955 Presence of coronary angioplasty implant and graft: Secondary | ICD-10-CM | POA: Insufficient documentation

## 2018-02-14 DIAGNOSIS — I2510 Atherosclerotic heart disease of native coronary artery without angina pectoris: Secondary | ICD-10-CM | POA: Insufficient documentation

## 2018-02-14 MED ORDER — AMLODIPINE BESYLATE 10 MG PO TABS: 10.0000 mg | ORAL_TABLET | Freq: Every day | ORAL | 0 refills | Status: DC

## 2018-02-14 MED ORDER — METOPROLOL SUCCINATE ER 50 MG PO TB24: 50.0000 mg | ORAL_TABLET | Freq: Every day | ORAL | 1 refills | Status: AC

## 2018-02-14 NOTE — Patient Instructions (Signed)

## 2018-02-14 NOTE — Telephone Encounter (Signed)
Patient canceled her appointment. She states "there is nothing wrong with my bowels and I don't need to have this done."

## 2018-02-14 NOTE — Progress Notes (Signed)
Assessment & Plan:  Cindy Hess was seen today for gynecologic exam.  Diagnoses and all orders for this visit:  Encounter for Papanicolaou smear for cervical cancer screening -     Cytology - PAP  Essential hypertension -     metoprolol succinate (TOPROL-XL) 50 MG 24 hr tablet; Take 1 tablet (50 mg total) by mouth daily. Take with or immediately following a meal. -     amLODipine (NORVASC) 10 MG tablet; Take 1 tablet (10 mg total) by mouth daily. Blood pressure elevated today however she is out of metoprolol and amlodipine. Will recheck in a few weeks after she picks her medications up from the pharmacy. She denies chest pain, shortness of breath, palpitations, lightheadedness, dizziness, headaches or BLE edema.  BP Readings from Last 3 Encounters:  02/14/18 (!) 143/82  02/13/18 (!) 148/84  02/08/18 (!) 168/78   Continue all antihypertensives as prescribed.  Remember to bring in your blood pressure log with you for your follow up appointment.  DASH/Mediterranean Diets are healthier choices for HTN.      Patient has been counseled on age-appropriate routine health concerns for screening and prevention. These are reviewed and up-to-date. Referrals have been placed accordingly. Immunizations are up-to-date or declined.    Subjective:   Chief Complaint  Patient presents with  . Gynecologic Exam    Pt. is here for pap smear and BP.    HPI Cindy Hess 62 y.o. female presents to office today   Review of Systems  Constitutional: Negative.  Negative for chills, fever, malaise/fatigue and weight loss.  Respiratory: Negative.  Negative for cough, shortness of breath and wheezing.   Cardiovascular: Negative.  Negative for chest pain, orthopnea and leg swelling.  Gastrointestinal: Negative for abdominal pain.  Genitourinary: Negative.  Negative for flank pain.  Skin: Negative.  Negative for rash.  Psychiatric/Behavioral: Negative for suicidal ideas.    Past Medical History:    Diagnosis Date  . Blind left eye   . Coronary artery disease   . GERD (gastroesophageal reflux disease)   . Hyperlipidemia   . Hypertension   . PAD (peripheral artery disease) (HCC)   . Stroke [facility]) 1997    Past Surgical History:  Procedure Laterality Date  . CORONARY ANGIOPLASTY WITH STENT PLACEMENT     3 stent    Family History  Problem Relation Age of Onset  . Hypertension Mother     Social History Reviewed with no changes to be made today.   Outpatient Medications Prior to Visit  Medication Sig Dispense Refill  . atorvastatin (LIPITOR) 40 MG tablet Take 1 tablet (40 mg total) by mouth daily. 90 tablet 1  . calcium-vitamin D 250-100 MG-UNIT tablet Take by mouth.    . Cholecalciferol (VITAMIN D PO) Take 1,000 Units by mouth.    . clopidogrel (PLAVIX) 75 MG tablet Take 1 tablet (75 mg total) by mouth daily. 90 tablet 3  . losartan (COZAAR) 100 MG tablet Take 1 tablet (100 mg total) by mouth daily. 90 tablet 2  . pantoprazole (PROTONIX) 20 MG tablet Take 1 tablet (20 mg total) by mouth daily. 90 tablet 2  . amLODipine (NORVASC) 10 MG tablet Take 1 tablet (10 mg total) by mouth daily. 90 tablet 0  . metoprolol succinate (TOPROL-XL) 50 MG 24 hr tablet Take 1 tablet (50 mg total) by mouth daily. Take with or immediately following a meal. 30 tablet 1   No facility-administered medications prior to visit.     Allergies  Allergen  Reactions  . Lisinopril     Cough   . Lovastatin Cough       Objective:    BP (!) 143/82 (BP Location: Left Arm, Patient Position: Sitting, Cuff Size: Normal)   Pulse 65   Temp 98.6 F (37 C) (Oral)   Ht 5\' 2"  (1.575 m)   Wt 106 lb 6.4 oz (48.3 kg)   SpO2 99%   BMI 19.46 kg/m  Wt Readings from Last 3 Encounters:  02/14/18 106 lb 6.4 oz (48.3 kg)  02/13/18 107 lb (48.5 kg)  02/08/18 106 lb (48.1 kg)    Physical Exam  Constitutional: She is oriented to person, place, and time. She appears well-developed and well-nourished.  HENT:   Head: Normocephalic.  Cardiovascular: Normal rate, regular rhythm and normal heart sounds.  Pulmonary/Chest: Effort normal and breath sounds normal.  Abdominal: Soft. Bowel sounds are normal. Hernia confirmed negative in the right inguinal area and confirmed negative in the left inguinal area.  Genitourinary: Rectum normal, vagina normal and uterus normal. Rectal exam shows no external hemorrhoid. No labial fusion. There is no rash, tenderness, lesion or injury on the right labia. There is no rash, tenderness, lesion or injury on the left labia. Uterus is not deviated and not enlarged. Cervix exhibits no motion tenderness and no friability. Right adnexum displays no mass, no tenderness and no fullness. Left adnexum displays no mass, no tenderness and no fullness. No erythema, tenderness or bleeding in the vagina. No foreign body in the vagina. No signs of injury around the vagina. No vaginal discharge found.  Lymphadenopathy: No inguinal adenopathy noted on the right or left side.       Right: No inguinal adenopathy present.       Left: No inguinal adenopathy present.  Neurological: She is alert and oriented to person, place, and time.  Skin: Skin is warm and dry.  Psychiatric: She has a normal mood and affect. Her behavior is normal. Judgment and thought content normal.       Patient has been counseled extensively about nutrition and exercise as well as the importance of adherence with medications and regular follow-up. The patient was given clear instructions to go to ER or return to medical center if symptoms don't improve, worsen or new problems develop. The patient verbalized understanding.   Follow-up: Return in about 4 weeks (around 03/14/2018) for BP recheck.   Claiborne Rigg, FNP-BC [facility] [place], [place] [phone]   02/14/2018, 3:02 PM

## 2018-02-15 NOTE — Telephone Encounter (Signed)
Thank you so much

## 2018-02-19 ENCOUNTER — Encounter: Payer: Medicaid Other | Admitting: Gastroenterology

## 2018-02-20 ENCOUNTER — Ambulatory Visit: Payer: Medicaid Other

## 2018-02-20 LAB — CYTOLOGY - PAP
Bacterial vaginitis: NEGATIVE
CANDIDA VAGINITIS: NEGATIVE
Chlamydia: NEGATIVE
Diagnosis: NEGATIVE
HPV (WINDOPATH): NOT DETECTED
Neisseria Gonorrhea: NEGATIVE
Trichomonas: NEGATIVE

## 2018-02-21 ENCOUNTER — Encounter (HOSPITAL_COMMUNITY): Payer: Medicaid Other

## 2018-02-22 ENCOUNTER — Encounter (HOSPITAL_COMMUNITY): Payer: Medicaid Other

## 2018-02-26 ENCOUNTER — Telehealth: Payer: Self-pay

## 2018-02-26 NOTE — Telephone Encounter (Signed)
-----   Message from Claiborne Rigg, NP sent at 02/22/2018  9:19 PM EDT ----- PAP smear is normal.

## 2018-02-26 NOTE — Telephone Encounter (Signed)
CMA spoke to patient to inform on results.  Pt. verfiied DOB. Pt. Understood.

## 2018-02-28 ENCOUNTER — Ambulatory Visit (HOSPITAL_COMMUNITY)
Admission: RE | Admit: 2018-02-28 | Discharge: 2018-02-28 | Disposition: A | Discharge status: Home / Self Care | Payer: Medicaid Other | Source: Ambulatory Visit | Attending: Vascular Surgery | Admitting: Vascular Surgery

## 2018-02-28 DIAGNOSIS — I739 Peripheral vascular disease, unspecified: Secondary | ICD-10-CM | POA: Insufficient documentation

## 2018-03-06 ENCOUNTER — Ambulatory Visit (HOSPITAL_COMMUNITY): Payer: Medicaid Other

## 2018-03-06 ENCOUNTER — Ambulatory Visit (HOSPITAL_COMMUNITY): Admission: RE | Admit: 2018-03-06 | Payer: Medicaid Other | Source: Ambulatory Visit

## 2018-03-14 ENCOUNTER — Encounter: Payer: Medicaid Other | Admitting: Pharmacist

## 2018-03-14 NOTE — Progress Notes (Deleted)
   S:    Patient arrives ***.    Presents to the clinic for hypertension evaluation, counseling, and management. Patient was referred by Dr. Marland Kitchen*** on ***.  Patient was last seen by Primary Care Provider on ***. Last Rx Clinic visit *** - at that time ***.  Patient {Actions; denies-reports:120008} adherence with medications.  Current BP Medications include:  ***  Antihypertensives tried in the past include: ***  Dietary habits include: *** Exercise habits include:*** Family / Social history: ***  ASCVD risk factors include:***  Home BP readings: ***  O:  Physical Exam   ROS  Last 3 Office BP readings: BP Readings from Last 3 Encounters:  02/14/18 (!) 143/82  02/13/18 (!) 148/84  02/08/18 (!) 168/78   BMET    Component Value Date/Time   NA 143 12/20/2017 1207   K 4.2 12/20/2017 1207   CL 103 12/20/2017 1207   CO2 21 12/20/2017 1207   GLUCOSE 125 (H) 12/20/2017 1207   BUN 12 12/20/2017 1207   CREATININE 0.71 12/20/2017 1207   CALCIUM 10.4 (H) 12/20/2017 1207   GFRNONAA 92 12/20/2017 1207   GFRAA 106 12/20/2017 1207    Renal function: CrCl cannot be calculated (Patient's most recent lab result is older than the maximum 21 days allowed.).  A/P: Hypertension longstanding/newly diagnosed currently *** on current medications. BP Goal = *** mmHg. Patient {Is/is not:9024} adherent with current medications.  -{Meds adjust:18428} ***.  -F/u labs ordered - *** -Counseled on lifestyle modifications for blood pressure control including reduced dietary sodium, increased exercise, adequate sleep  Results reviewed and written information provided.   Total time in face-to-face counseling *** minutes.   F/U Clinic Visit in ***.  Patient seen with ***

## 2018-03-30 ENCOUNTER — Ambulatory Visit: Payer: Medicaid Other

## 2018-07-25 ENCOUNTER — Other Ambulatory Visit: Payer: Self-pay | Admitting: Nurse Practitioner

## 2018-07-25 DIAGNOSIS — I10 Essential (primary) hypertension: Secondary | ICD-10-CM

## 2018-08-15 ENCOUNTER — Encounter (HOSPITAL_COMMUNITY): Payer: Self-pay | Admitting: Emergency Medicine

## 2018-08-15 ENCOUNTER — Emergency Department (HOSPITAL_COMMUNITY)
Admission: EM | Admit: 2018-08-15 | Discharge: 2018-08-16 | Disposition: A | Discharge status: Home / Self Care | Payer: Medicaid Other | Attending: Emergency Medicine | Admitting: Emergency Medicine

## 2018-08-15 ENCOUNTER — Other Ambulatory Visit: Payer: Self-pay

## 2018-08-15 DIAGNOSIS — R112 Nausea with vomiting, unspecified: Secondary | ICD-10-CM | POA: Insufficient documentation

## 2018-08-15 DIAGNOSIS — K551 Chronic vascular disorders of intestine: Secondary | ICD-10-CM | POA: Insufficient documentation

## 2018-08-15 DIAGNOSIS — I2510 Atherosclerotic heart disease of native coronary artery without angina pectoris: Secondary | ICD-10-CM | POA: Insufficient documentation

## 2018-08-15 DIAGNOSIS — I7090 Unspecified atherosclerosis: Secondary | ICD-10-CM | POA: Diagnosis not present

## 2018-08-15 DIAGNOSIS — F17210 Nicotine dependence, cigarettes, uncomplicated: Secondary | ICD-10-CM | POA: Insufficient documentation

## 2018-08-15 DIAGNOSIS — E860 Dehydration: Secondary | ICD-10-CM | POA: Insufficient documentation

## 2018-08-15 DIAGNOSIS — R109 Unspecified abdominal pain: Secondary | ICD-10-CM | POA: Diagnosis present

## 2018-08-15 DIAGNOSIS — R1111 Vomiting without nausea: Secondary | ICD-10-CM | POA: Diagnosis not present

## 2018-08-15 DIAGNOSIS — I10 Essential (primary) hypertension: Secondary | ICD-10-CM | POA: Diagnosis not present

## 2018-08-15 DIAGNOSIS — I959 Hypotension, unspecified: Secondary | ICD-10-CM | POA: Diagnosis not present

## 2018-08-15 DIAGNOSIS — R110 Nausea: Secondary | ICD-10-CM | POA: Diagnosis not present

## 2018-08-15 DIAGNOSIS — R0902 Hypoxemia: Secondary | ICD-10-CM | POA: Diagnosis not present

## 2018-08-15 DIAGNOSIS — K55059 Acute (reversible) ischemia of intestine, part and extent unspecified: Secondary | ICD-10-CM | POA: Diagnosis not present

## 2018-08-15 MED ORDER — ONDANSETRON HCL 4 MG/2ML IJ SOLN
4.0000 mg | Freq: Once | INTRAMUSCULAR | Status: AC
  Administered 2018-08-15: 4 mg via INTRAVENOUS
  Filled 2018-08-15: qty 2

## 2018-08-15 MED ORDER — LACTATED RINGERS IV BOLUS
1000.0000 mL | Freq: Once | INTRAVENOUS | Status: AC
  Administered 2018-08-15: 1000 mL via INTRAVENOUS

## 2018-08-15 MED ORDER — FENTANYL CITRATE (PF) 100 MCG/2ML IJ SOLN
50.0000 ug | Freq: Once | INTRAMUSCULAR | Status: AC
  Administered 2018-08-15: 50 ug via INTRAVENOUS
  Filled 2018-08-15: qty 2

## 2018-08-15 NOTE — ED Triage Notes (Signed)
Per EMS, pt from home. Pt reports intermittent N/V, abd pain and gas for the last month. Pt has been unable to have a successful BM for the last two weeks. A&Ox4, ambulatory. EMS VSS. EMS gave 4mg  zofran.   Blind in left eye.

## 2018-08-15 NOTE — ED Provider Notes (Signed)
Emergency Department Provider Note   I have reviewed the triage vital signs and the nursing notes.   HISTORY  Chief Complaint Nausea; Abdominal Pain; and Constipation   HPI Cindy Hess is a 63 y.o. female with multiple medical problems documented below who presents the emergency department today secondary to abdominal pain, constipation, nausea and vomiting.  Patient has bilateral iliac artery stents and femoral claudication in the past with a known celiac artery of approximately 50% that presents here with worsening postprandial abdominal pain and vomiting with some associated constipation as well.  States she went 3 or 4 weeks without having a bowel movement that she had increased bowel movements for 3 to 4 days but now has not had another bowel movement for a week and she wants to get evaluated prior to it lasting a long time again.  No urinary symptoms.  States she has had what feels like a fever but has not measured her temperature.  No new cough or shortness of breath.  No rashes.   No other associated or modifying symptoms.    Past Medical History:  Diagnosis Date   Blind left eye    Coronary artery disease    GERD (gastroesophageal reflux disease)    Hyperlipidemia    Hypertension    PAD (peripheral artery disease) (HCC)    Stroke [facility](HCC) 1997    Patient Active Problem List   Diagnosis Date Noted   PVD (peripheral vascular disease) (HCC) 02/13/2018    Past Surgical History:  Procedure Laterality Date   CORONARY ANGIOPLASTY WITH STENT PLACEMENT     3 stent    Current Outpatient Rx   Order #: [id number] Class: Normal   Order #: [id number] Class: Normal   Order #: [id number] Class: Historical Med   Order #: [id number] Class: Historical Med   Order #: [id number] Class: Normal   Order #: [id number] Class: Normal   Order #: [id number] Class: Normal   Order #: [id number] Class: Print   Order #: [id number] Class: Normal    Allergies Lisinopril and  Lovastatin  Family History  Problem Relation Age of Onset   Hypertension Mother     Social History Social History   Tobacco Use   Smoking status: Current Every Day Smoker    Packs/day: 0.25    Years: 3.00    Pack years: 0.75    Types: Cigarettes   Smokeless tobacco: Never Used  Substance Use Topics   Alcohol use: Yes    Comment: socially    Drug use: Not Currently    Review of Systems  All other systems negative except as documented in the HPI. All pertinent positives and negatives as reviewed in the HPI. ____________________________________________   PHYSICAL EXAM:  VITAL SIGNS: ED Triage Vitals  Enc Vitals Group     BP 08/15/18 2252 101/82     Pulse Rate 08/15/18 2252 60     Resp 08/15/18 2252 16     Temp 08/15/18 2252 98.1 F (36.7 C)     Temp Source 08/15/18 2252 Oral     SpO2 08/15/18 2252 100 %     Weight 08/15/18 2251 95 lb (43.1 kg)     Height 08/15/18 2251 5\' 2"  (1.575 m)    Constitutional: Alert and oriented. Well appearing and in no acute distress. Eyes: Conjunctivae are normal. PERRL. EOMI. Head: Atraumatic. Nose: No congestion/rhinnorhea. Mouth/Throat: Mucous membranes are moist.  Oropharynx non-erythematous. Neck: No stridor.  No meningeal signs.   Cardiovascular: Normal rate, regular rhythm. Good peripheral circulation. Grossly normal heart sounds.  Respiratory: Normal respiratory effort.  No retractions. Lungs CTAB. Gastrointestinal: diffuse ttp without rebound or guarding. Mild distension but not tympanic.  Musculoskeletal: No lower extremity tenderness nor edema. No gross deformities of extremities. Neurologic:  Normal speech and language. No gross focal neurologic deficits are appreciated.  Skin:  Skin is warm, dry and intact. No rash noted.  ____________________________________________   LABS (all labs ordered are listed, but only abnormal results are displayed)  Labs Reviewed  CBC WITH DIFFERENTIAL/PLATELET - Abnormal; Notable  for the following components:      Result Value   Hemoglobin 10.9 (*)    HCT 35.9 (*)    MCV 74.2 (*)    MCH 22.5 (*)    RDW 22.5 (*)    Platelets 449 (*)    All other components within normal limits  COMPREHENSIVE METABOLIC PANEL - Abnormal; Notable for the following components:   Glucose, Bld 184 (*)    Total Bilirubin 1.3 (*)    All other components within normal limits  URINE CULTURE  LIPASE, BLOOD   ____________________________________________  RADIOLOGY  Ct Angio Abd/pel W And/or Wo Contrast  Result Date: 08/16/2018 CLINICAL DATA:  Nausea, vomiting and abdominal pain EXAM: CTA ABDOMEN AND PELVIS without AND WITH CONTRAST TECHNIQUE: Multidetector CT imaging of the abdomen and pelvis was performed using the standard protocol during bolus administration of intravenous contrast. Multiplanar reconstructed images and MIPs were obtained and reviewed to evaluate the vascular anatomy. CONTRAST:  OMNIPAQUE IOHEXOL 350 MG/ML SOLN COMPARISON:  None. FINDINGS: VASCULAR Aorta: No aortic aneurysm or dissection. There is moderate mixed density aortic atherosclerosis. Celiac: Occluded at its origin SMA: Occluded at its origin Renals: Mild narrowing of the proximal right renal artery. Left renal artery is normal. IMA: The origin is severely narrowed or occluded. There is distal opacification via the marginal artery of [place]mond. Inflow: There are bilateral internal iliac artery stents with patency maintained to the origins of the femoral arteries. Both femoral arteries are narrowed. The internal iliac arteries are occluded. Proximal Outflow: Both common femoral arteries are narrowed. Veins: The hepatic veins and inferior vena cava are patent. Portal vein, splenic vein and superior mesenteric vein are patent. Review of the MIP images confirms the above findings. NON-VASCULAR LOWER CHEST: There is no basilar pleural or apical pericardial effusion. HEPATOBILIARY: The hepatic contours and density are  normal. There is no intra- or extrahepatic biliary dilatation. The gallbladder is normal. PANCREAS: The pancreatic parenchymal contours are normal and there is no ductal dilatation. There is no peripancreatic fluid collection. SPLEEN: Normal. ADRENALS/URINARY TRACT: --Adrenal glands: Normal. --Right kidney/ureter: No hydronephrosis, nephroureterolithiasis, perinephric stranding or solid renal mass. --Left kidney/ureter: No hydronephrosis, nephroureterolithiasis, perinephric stranding or solid renal mass. --Urinary bladder: Normal for degree of distention STOMACH/BOWEL: --Stomach/Duodenum: There is no hiatal hernia or other gastric abnormality. The duodenal course and caliber are normal. --Small bowel: No dilatation or inflammation. There is no pneumatosis. No free intraperitoneal air. Normal bowel enhancement. --Colon: No focal abnormality. --Appendix: Not visualized. No right lower quadrant inflammation or free fluid. LYMPHATIC: No abdominal or pelvic lymphadenopathy. REPRODUCTIVE: Normal uterus and ovaries. MUSCULOSKELETAL. No bony spinal canal stenosis or focal osseous abnormality. OTHER: None. IMPRESSION: 1. 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 The inferior mesenteric artery is also proximally occluded with Rea opacification via the marginal artery of Drummond. 2. No bowel wall abnormality to indicate bowel ischemia. Electronically Signed   By: Deatra Robinson  M.D.   On: 08/16/2018 01:09   ____________________________________________   INITIAL IMPRESSION / ASSESSMENT AND PLAN / ED COURSE  eval for any new mesenteric ischemia vs bowel obstruction. Symptomatic treatmetn in meantime.   CT scan with diffuse arterial occlusions.  Discussed with Dr. early with vascular surgery who came to see the patient and felt that she was stable for discharge as long she was tolerating p.o., which she was.  He is going  to have his office call her later today and set up an appointment soon for revascularization but no acute indication for hospitalization at this time.  Pertinent labs & imaging results that were available during my care of the patient were reviewed by me and considered in my medical decision making (see chart for details).  A medical screening exam was performed and I feel the patient has had an appropriate workup for their chief complaint at this time and likelihood of emergent condition existing is low. They have been counseled on decision, discharge, follow up and which symptoms necessitate immediate return to the emergency department. They or their family verbally stated understanding and agreement with plan and discharged in stable condition.   ____________________________________________  FINAL CLINICAL IMPRESSION(S) / ED DIAGNOSES  Final diagnoses:  Chronic mesenteric ischemia (HCC)    MEDICATIONS GIVEN DURING THIS VISIT:  Medications  lactated ringers bolus 1,000 mL (0 mLs Intravenous Stopped 08/16/18 0130)  ondansetron (ZOFRAN) injection 4 mg (4 mg Intravenous Given 08/15/18 2339)  fentaNYL (SUBLIMAZE) injection 50 mcg (50 mcg Intravenous Given 08/15/18 2339)  iohexol (OMNIPAQUE) 350 MG/ML injection 100 mL (100 mLs Intravenous Contrast Given 08/16/18 0047)     NEW OUTPATIENT MEDICATIONS STARTED DURING THIS VISIT:  Discharge Medication List as of 08/16/2018  2:37 AM    START taking these medications   Details  ondansetron (ZOFRAN) 4 MG tablet Take 1 tablet (4 mg total) by mouth every 8 (eight) hours as needed for nausea or vomiting., Starting Thu 08/16/2018, Print        Note:  This note was prepared with assistance of Dragon voice recognition software. Occasional wrong-word or sound-a-like substitutions may have occurred due to the inherent limitations of voice recognition software.   Kia Varnadore, Barbara Cower, MD 08/16/18 [phone]

## 2018-08-16 ENCOUNTER — Ambulatory Visit: Payer: Medicaid Other

## 2018-08-16 ENCOUNTER — Emergency Department (HOSPITAL_COMMUNITY): Payer: Medicaid Other

## 2018-08-16 ENCOUNTER — Telehealth: Payer: Self-pay

## 2018-08-16 DIAGNOSIS — I7090 Unspecified atherosclerosis: Secondary | ICD-10-CM | POA: Diagnosis not present

## 2018-08-16 DIAGNOSIS — K55059 Acute (reversible) ischemia of intestine, part and extent unspecified: Secondary | ICD-10-CM | POA: Diagnosis not present

## 2018-08-16 LAB — COMPREHENSIVE METABOLIC PANEL
ALT: 12 U/L (ref 0–44)
AST: 28 U/L (ref 15–41)
Albumin: 3.9 g/dL (ref 3.5–5.0)
Alkaline Phosphatase: 90 U/L (ref 38–126)
Anion gap: 15 (ref 5–15)
BUN: 13 mg/dL (ref 8–23)
CO2: 22 mmol/L (ref 22–32)
Calcium: 10.2 mg/dL (ref 8.9–10.3)
Chloride: 101 mmol/L (ref 98–111)
Creatinine, Ser: 0.94 mg/dL (ref 0.44–1.00)
GFR calc Af Amer: >60 mL/min (ref >60)
GFR calc non Af Amer: >60 mL/min (ref >60)
Glucose, Bld: 184 mg/dL — ABNORMAL HIGH (ref 70–99)
Potassium: 3.6 mmol/L (ref 3.5–5.1)
Sodium: 138 mmol/L (ref 135–145)
Total Bilirubin: 1.3 mg/dL — ABNORMAL HIGH (ref 0.3–1.2)
Total Protein: 7.6 g/dL (ref 6.5–8.1)

## 2018-08-16 LAB — CBC WITH DIFFERENTIAL/PLATELET
Abs Immature Granulocytes: 0 10*3/uL (ref 0.00–0.07)
Basophils Absolute: 0.1 10*3/uL (ref 0.0–0.1)
Basophils Relative: 1 %
Eosinophils Absolute: 0 10*3/uL (ref 0.0–0.5)
Eosinophils Relative: 0 %
HCT: 35.9 % — ABNORMAL LOW (ref 36.0–46.0)
Hemoglobin: 10.9 g/dL — ABNORMAL LOW (ref 12.0–15.0)
Lymphocytes Relative: 43 %
Lymphs Abs: 2.8 10*3/uL (ref 0.7–4.0)
MCH: 22.5 pg — ABNORMAL LOW (ref 26.0–34.0)
MCHC: 30.4 g/dL (ref 30.0–36.0)
MCV: 74.2 fL — ABNORMAL LOW (ref 80.0–100.0)
Monocytes Absolute: 0.1 10*3/uL (ref 0.1–1.0)
Monocytes Relative: 2 %
Neutro Abs: 3.5 10*3/uL (ref 1.7–7.7)
Neutrophils Relative %: 54 %
Platelets: 449 10*3/uL — ABNORMAL HIGH (ref 150–400)
RBC: 4.84 MIL/uL (ref 3.87–5.11)
RDW: 22.5 % — ABNORMAL HIGH (ref 11.5–15.5)
WBC: 6.4 10*3/uL (ref 4.0–10.5)
nRBC: 0 % (ref 0.0–0.2)
nRBC: 0 /100 WBC

## 2018-08-16 LAB — LIPASE, BLOOD: Lipase: 29 U/L (ref 11–51)

## 2018-08-16 MED ORDER — IOHEXOL 350 MG/ML SOLN
100.0000 mL | Freq: Once | INTRAVENOUS | Status: AC | PRN
  Administered 2018-08-16: 100 mL via INTRAVENOUS

## 2018-08-16 MED ORDER — ONDANSETRON HCL 4 MG PO TABS: 4.0000 mg | ORAL_TABLET | Freq: Three times a day (TID) | ORAL | 0 refills | Status: AC | PRN

## 2018-08-16 NOTE — ED Notes (Signed)
Pt verbalizes understanding of d/c instructions. Prescriptions reviewed with patient. Pt ambulatory at d/c with all belongings.  

## 2018-08-16 NOTE — Telephone Encounter (Signed)
Attempted to call patient 4 times to notify her that we needed her to be at the hospital earlier than the original scheduled time. After the 4th attempt with no return call, VM was left advising her to be at the hospital at 1030. Also looked on Care Everywhere and called the number listed there as well with no luck.   Julious Payer, CMA

## 2018-08-16 NOTE — Consult Note (Signed)
Vascular and Vein Specialist of [place]  Patient name: Cindy Hess MRN: [id number] DOB: 01-04-1956 Sex: female  REASON FOR CONSULT: Evaluation for chronic mesenteric ischemia  HPI: Cindy Hess is a 63 y.o. female, who is seen in the emergency department for evaluation of chronic mesenteric ischemia.  She reports that over the past several months she has had progressive GI difficulty.  She has several components of her complaints.  She reports sensation of food hanging up in her chest when she eats large meal particularly meat.  She also reports some pain following eating and is eating small meals.  She has had significant constipation and her main complaint and reason for presentation to the emergency department this evening was to prevent bout of severe constipation that she had had approximately 4 to 6 weeks ago.  She reports that she has lost 12 pounds over the past several months.  Pain currently.  She is comfortable and answers all questions appropriately.  She had prior peripheral vascular disease treatment at [facility] in 2017.  At that time she had right common femoral endarterectomy and retrograde right common and external stents placed and also left common and external iliac stents placed.  This was by Dr. Janee Morn at [place].  She saw Dr. Chestine Spore in our office and October 2019 to establish care in [place] since she had recently moved to [place].  At that time she was having no active issues.  She does have a history of cardiac disease as well and underwent cardiac stenting.  This was around 2017 according to the patient around the time of her peripheral arterial procedures.  Cardiac difficulty and her only chest discomfort is the sensation of fullness when she eats.  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  The patient does report that she quit smoking 1 month ago.  Past Medical History:  Diagnosis Date  . Blind left eye   . Coronary artery disease   . GERD (gastroesophageal reflux disease)   . Hyperlipidemia   . Hypertension   . PAD (peripheral artery disease) (HCC)   . Stroke [facility]) 1997    Family History  Problem Relation Age of Onset  . Hypertension Mother     SOCIAL HISTORY: Social History   Socioeconomic History  . Marital status: Single    Spouse name: Not on file  . Number of children: Not on file  . Years of education: Not on file  . Highest education level: Not on file  Occupational History  . Not on file  Social Needs  . Financial resource strain: Not on file  . Food insecurity:    Worry: Not on file    Inability: Not on file  . Transportation needs:    Medical: Not on file    Non-medical: Not on file  Tobacco Use  . Smoking status: Current Every Day Smoker    Packs/day: 0.25    Years: 3.00    Pack years: 0.75    Types: Cigarettes  . Smokeless tobacco: Never Used  Substance and Sexual Activity  . Alcohol use: Yes    Comment: socially   . Drug use: Not Currently  . Sexual activity: Not Currently  Lifestyle  . Physical activity:    Days per week: Not on file  Minutes per session: Not on file  . Stress: Not on file  Relationships  . Social connections:    Talks on phone: Not on file    Gets together: Not on file    Attends religious service: Not on file    Active member of club or organization: Not on file    Attends meetings of clubs or organizations: Not on file    Relationship status: Not on file  . Intimate partner violence:    Fear of current or ex partner: Not on file    Emotionally abused: Not on file    Physically abused: Not on file    Forced sexual activity: Not on file  Other Topics Concern  . Not on file  Social History Narrative  . Not on file    Allergies   Allergen Reactions  . Lisinopril Cough  . Lovastatin Cough    No current facility-administered medications for this encounter.    Current Outpatient Medications  Medication Sig Dispense Refill  . amLODipine (NORVASC) 10 MG tablet Take 1 tablet by mouth once daily 90 tablet 0  . atorvastatin (LIPITOR) 40 MG tablet Take 1 tablet (40 mg total) by mouth daily. 90 tablet 1  . calcium-vitamin D 250-100 MG-UNIT tablet Take by mouth.    . Cholecalciferol (VITAMIN D PO) Take 1,000 Units by mouth.    . clopidogrel (PLAVIX) 75 MG tablet Take 1 tablet (75 mg total) by mouth daily. 90 tablet 3  . losartan (COZAAR) 100 MG tablet Take 1 tablet (100 mg total) by mouth daily. 90 tablet 2  . metoprolol succinate (TOPROL-XL) 50 MG 24 hr tablet Take 1 tablet (50 mg total) by mouth daily. Take with or immediately following a meal. 90 tablet 1  . ondansetron (ZOFRAN) 4 MG tablet Take 1 tablet (4 mg total) by mouth every 8 (eight) hours as needed for nausea or vomiting. 30 tablet 0  . pantoprazole (PROTONIX) 20 MG tablet Take 1 tablet (20 mg total) by mouth daily. 90 tablet 2    REVIEW OF SYSTEMS:  [X]  denotes positive finding, [ ]  denotes negative finding Cardiac  Comments:  Chest pain or chest pressure:    Shortness of breath upon exertion:    Short of breath when lying flat:    Irregular heart rhythm:        Vascular    Pain in calf, thigh, or hip brought on by ambulation:    Pain in feet at night that wakes you up from your sleep:     Blood clot in your veins:    Leg swelling:         Pulmonary    Oxygen at home:    Productive cough:     Wheezing:         Neurologic    Sudden weakness in arms or legs:     Sudden numbness in arms or legs:     Sudden onset of difficulty speaking or slurred speech:    Temporary loss of vision in one eye:     Problems with dizziness:         Gastrointestinal    Blood in stool:     Vomited blood:         Genitourinary    Burning when urinating:     Blood  in urine:        Psychiatric    Major depression:         Hematologic    Bleeding problems:  Problems with blood clotting too easily:        Skin    Rashes or ulcers:        Constitutional    Fever or chills:      PHYSICAL EXAM: Vitals:   08/16/18 0130 08/16/18 0145 08/16/18 0200 08/16/18 0215  BP:      Pulse: (!) 57 (!) 56 (!) 58 (!) 57  Resp: 16 15 13 13   Temp:      TempSrc:      SpO2: 100% 100% 100% 100%  Weight:      Height:        GENERAL: The patient is a well-nourished female, in no acute distress. The vital signs are documented above. CARDIOVASCULAR: 2+ radial 2+ femoral and 2+ dorsalis pedis pulses bilaterally PULMONARY: There is good air exchange  ABDOMEN: Soft and non-tender.  No guarding or rebound. MUSCULOSKELETAL: There are no major deformities or cyanosis. NEUROLOGIC: No focal weakness or paresthesias are detected. SKIN: There are no ulcers or rashes noted. PSYCHIATRIC: The patient has a normal affect.  DATA:  CT angiogram of abdomen and pelvis was reviewed.  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  Also has occlusion of both internal iliac arteries.  There is reconstitution of all 3 mesenteric vessels but these appear to be small.  Pneumatosis or acute ischemia to her bowel.  MEDICAL ISSUES: Chronic mesenteric ischemia.  No evidence of acute bowel infarction.  Main complaint this evening is of constipation.  Long discussion regarding the reason for her abdominal pain and weight loss.  She has severe chronic mesenteric ischemia with occlusion of all mesenteric vessels.  Will require arteriography for further planning of elective treatment.  I did explain that this would in all likelihood require major abdominal revascularization.  She has had no cardiac follow-up since her prior stenting 3 years ago.  We will also obtain cardiac evaluation to rule out any recurrent disease given her diffuse  cardiovascular pathology.  I explained that our office will call after it opens later today to schedule outpatient arteriogram.   Larina Earthly, MD [facility] and Vein Specialists of [facility] Tel [phone] Pager [phone]

## 2018-08-16 NOTE — ED Notes (Signed)
Surgery at bedside.

## 2018-08-17 ENCOUNTER — Ambulatory Visit (HOSPITAL_COMMUNITY): Admission: RE | Admit: 2018-08-17 | Payer: Medicaid Other | Source: Home / Self Care | Admitting: Vascular Surgery

## 2018-08-17 ENCOUNTER — Encounter (HOSPITAL_COMMUNITY): Admission: RE | Payer: Self-pay | Source: Home / Self Care

## 2018-08-17 SURGERY — ABDOMINAL AORTOGRAM
Anesthesia: LOCAL

## 2018-08-17 NOTE — Progress Notes (Signed)
Attempted to contact patient at 1100 for scheduled procedure today with Dr. Kemper Durie.  Patient scheduled to arrive at 1030.  Made Dr. Chestine Spore aware. 1155: Dr. Chestine Spore over in [facility] and patient still no show, no call. Dr. Chestine Spore okay with patient being marked as no show.

## 2018-08-23 NOTE — Telephone Encounter (Signed)
Patient called out appt desk to schedule a "visit". Both Shanon and I tried to reach the patient at the phone number she left but neither could get through to reschedule her aortogram. We both left messages again for the patient to call us.

## 2018-08-24 ENCOUNTER — Other Ambulatory Visit: Payer: Self-pay | Admitting: Nurse Practitioner

## 2018-08-24 ENCOUNTER — Telehealth: Payer: Self-pay | Admitting: Nurse Practitioner

## 2018-08-24 DIAGNOSIS — K559 Vascular disorder of intestine, unspecified: Secondary | ICD-10-CM

## 2018-08-24 NOTE — Telephone Encounter (Signed)
Pt called In stated that since her last CT she was told she needed to have surgery and need to be authorized by her pcp please follow up

## 2018-08-24 NOTE — Telephone Encounter (Signed)
Referral placed.

## 2018-08-24 NOTE — Telephone Encounter (Signed)
Will route to PCP 

## 2019-05-10 ENCOUNTER — Telehealth: Payer: Self-pay | Admitting: Nurse Practitioner

## 2019-05-10 NOTE — Telephone Encounter (Signed)
Patient called and requested for the paperwork regarding pads, pull ups, and gloves from [employer] to be completed as your earliest convenience.

## 2019-05-15 NOTE — Telephone Encounter (Signed)
CMA had faxed the order to [employer].

## 2019-05-30 DIAGNOSIS — R32 Unspecified urinary incontinence: Secondary | ICD-10-CM | POA: Diagnosis not present

## 2019-06-24 DIAGNOSIS — R32 Unspecified urinary incontinence: Secondary | ICD-10-CM | POA: Diagnosis not present

## 2019-08-02 ENCOUNTER — Telehealth: Payer: Self-pay | Admitting: Nurse Practitioner

## 2019-08-02 NOTE — Telephone Encounter (Signed)
Rep called and requested for an update on the patients incontinence care. Paperwork was faxed over on the 31st of March and 2nd of April. Please follow up at your earliest convenience.

## 2019-08-05 NOTE — Telephone Encounter (Signed)
CMA had faxed over the paperwork.

## 2019-10-07 ENCOUNTER — Ambulatory Visit (INDEPENDENT_AMBULATORY_CARE_PROVIDER_SITE_OTHER): Payer: Medicaid Other

## 2019-10-07 ENCOUNTER — Other Ambulatory Visit: Payer: Self-pay

## 2019-10-07 VITALS — Ht 61.0 in | Wt 103.0 lb

## 2019-10-07 DIAGNOSIS — Z3202 Encounter for pregnancy test, result negative: Secondary | ICD-10-CM

## 2019-10-07 LAB — POCT URINE PREGNANCY: Preg Test, Ur: NEGATIVE

## 2019-10-07 NOTE — Progress Notes (Addendum)
[name] presents today for UPT. She has complaints of stomach pains.  LMP: POSTMENOPAUSAL    OBJECTIVE: Appears well, in no apparent distress.  OB History   No obstetric history on file.    Home UPT Result: NA In-Office UPT result: NEGATIVE  I have reviewed the patient's medical, obstetrical, social, and family histories, and medications.   ASSESSMENT: NEGATIVE pregnancy test  PLAN Patient will see her PCP to access her stomach pains  Patient was assessed and managed by nursing staff during this encounter. I have reviewed the chart and agree with the documentation and plan. I have also made any necessary editorial changes.  Coral Ceo, MD 10/08/2019 3:35 AM  .

## 2019-11-20 ENCOUNTER — Other Ambulatory Visit: Payer: Self-pay

## 2019-11-20 ENCOUNTER — Ambulatory Visit (INDEPENDENT_AMBULATORY_CARE_PROVIDER_SITE_OTHER): Payer: Medicaid Other | Admitting: Podiatry

## 2019-11-20 DIAGNOSIS — M79675 Pain in left toe(s): Secondary | ICD-10-CM | POA: Diagnosis not present

## 2019-11-20 DIAGNOSIS — B351 Tinea unguium: Secondary | ICD-10-CM

## 2019-11-20 DIAGNOSIS — M79674 Pain in right toe(s): Secondary | ICD-10-CM | POA: Diagnosis not present

## 2019-11-20 NOTE — Patient Instructions (Signed)

## 2019-11-21 ENCOUNTER — Ambulatory Visit: Payer: Medicaid Other | Admitting: Podiatry

## 2019-11-21 ENCOUNTER — Encounter: Payer: Self-pay | Admitting: Podiatry

## 2019-11-21 NOTE — Progress Notes (Signed)
  Subjective:  Patient ID: Cindy Hess, female    DOB: 12/15/1955,  MRN: [id number]  Chief Complaint  Patient presents with  . Nail Problem    thick painful toenails, patient is taking Plavix     64 y.o. female presents with the above complaint. History confirmed with patient. She has not had her nails cut since 2018. Very painful  Objective:  Physical Exam: warm, good capillary refill, no trophic changes or ulcerative lesions, normal DP and PT pulses and normal sensory exam. Severely elongated, mycotic, gryphotic toenails x10  Assessment:   1. Onychomycosis   2. Pain due to onychomycosis of toenails of both feet      Plan:  Patient was evaluated and treated and all questions answered.   Discussed the etiology and treatment options for the condition in detail with the patient. Educated patient on the topical and oral treatment options for mycotic nails. Recommended debridement of the nails today. Sharp and mechanical debridement performed of all painful and mycotic nails today. Nails debrided in length and thickness using a nail nipper and a mechanical burr to level of comfort. Discussed treatment options including appropriate shoe gear. Follow up as needed for painful nails.   Return in about 3 months (around 02/20/2020) for nail trim.

## 2019-12-27 NOTE — Telephone Encounter (Signed)
nikki from [employer] is calling checking on the status of paperwork she faxed on 12/20/2019. Lowella Bandy will refax paperwork.

## 2020-01-03 NOTE — Telephone Encounter (Signed)
CMA received the fax. Will fax it back once PCP review it.

## 2020-01-07 NOTE — Telephone Encounter (Signed)
Nikki with [employer] called in reference to CMN for that was faxed over

## 2020-01-10 NOTE — Telephone Encounter (Signed)
CMA faxed the supply.

## 2020-01-26 DIAGNOSIS — Z23 Encounter for immunization: Secondary | ICD-10-CM | POA: Diagnosis not present

## 2020-01-27 DIAGNOSIS — B351 Tinea unguium: Secondary | ICD-10-CM | POA: Diagnosis not present

## 2020-01-27 DIAGNOSIS — I10 Essential (primary) hypertension: Secondary | ICD-10-CM | POA: Diagnosis not present

## 2020-01-27 DIAGNOSIS — D649 Anemia, unspecified: Secondary | ICD-10-CM | POA: Diagnosis not present

## 2020-01-27 DIAGNOSIS — H269 Unspecified cataract: Secondary | ICD-10-CM | POA: Diagnosis not present

## 2020-01-27 DIAGNOSIS — E46 Unspecified protein-calorie malnutrition: Secondary | ICD-10-CM | POA: Diagnosis not present

## 2020-01-27 DIAGNOSIS — Z23 Encounter for immunization: Secondary | ICD-10-CM | POA: Diagnosis not present

## 2020-01-27 DIAGNOSIS — E785 Hyperlipidemia, unspecified: Secondary | ICD-10-CM | POA: Diagnosis not present

## 2020-02-10 DIAGNOSIS — M79604 Pain in right leg: Secondary | ICD-10-CM | POA: Diagnosis not present

## 2020-02-10 DIAGNOSIS — D8481 Immunodeficiency due to conditions classified elsewhere: Secondary | ICD-10-CM | POA: Diagnosis not present

## 2020-02-10 DIAGNOSIS — K5900 Constipation, unspecified: Secondary | ICD-10-CM | POA: Diagnosis not present

## 2020-02-10 DIAGNOSIS — I639 Cerebral infarction, unspecified: Secondary | ICD-10-CM | POA: Diagnosis not present

## 2020-02-10 DIAGNOSIS — E46 Unspecified protein-calorie malnutrition: Secondary | ICD-10-CM | POA: Diagnosis not present

## 2020-02-20 ENCOUNTER — Ambulatory Visit (INDEPENDENT_AMBULATORY_CARE_PROVIDER_SITE_OTHER): Payer: Medicare Other | Admitting: Podiatry

## 2020-02-20 ENCOUNTER — Other Ambulatory Visit: Payer: Self-pay

## 2020-02-20 DIAGNOSIS — M79675 Pain in left toe(s): Secondary | ICD-10-CM | POA: Diagnosis not present

## 2020-02-20 DIAGNOSIS — B351 Tinea unguium: Secondary | ICD-10-CM | POA: Diagnosis not present

## 2020-02-20 DIAGNOSIS — I739 Peripheral vascular disease, unspecified: Secondary | ICD-10-CM

## 2020-02-20 DIAGNOSIS — M79674 Pain in right toe(s): Secondary | ICD-10-CM | POA: Diagnosis not present

## 2020-02-20 NOTE — Progress Notes (Signed)
  Subjective:  Patient ID: Cindy Hess, female    DOB: 12/23/1955,  MRN: [id number]  Chief Complaint  Patient presents with  . routine foot care    nail trim      64 y.o. female presents with the above complaint. History confirmed with patient.  The nails are very painful Objective:  Physical Exam: warm, good capillary refill, no trophic changes or ulcerative lesions, normal DP and PT pulses and normal sensory exam. Severely elongated, mycotic, gryphotic toenails x10  Assessment:   1. Onychomycosis   2. Pain due to onychomycosis of toenails of both feet   3. PVD (peripheral vascular disease) (HCC)      Plan:  Patient was evaluated and treated and all questions answered.   Discussed the etiology and treatment options for the condition in detail with the patient. Educated patient on the topical and oral treatment options for mycotic nails. Recommended debridement of the nails today. Sharp and mechanical debridement performed of all painful and mycotic nails today. Nails debrided in length and thickness using a nail nipper and a mechanical burr to level of comfort. Discussed treatment options including appropriate shoe gear. Follow up as needed for painful nails.   No follow-ups on file.

## 2020-05-28 ENCOUNTER — Ambulatory Visit: Payer: Medicaid Other | Admitting: Podiatry

## 2020-06-16 ENCOUNTER — Other Ambulatory Visit: Payer: Self-pay

## 2020-06-16 ENCOUNTER — Emergency Department (HOSPITAL_COMMUNITY): Payer: Medicare Other

## 2020-06-16 ENCOUNTER — Inpatient Hospital Stay (HOSPITAL_COMMUNITY)
Admission: EM | Admit: 2020-06-16 | Discharge: 2020-06-23 | DRG: 314 | Disposition: E | Payer: Medicare Other | Attending: Internal Medicine | Admitting: Internal Medicine

## 2020-06-16 ENCOUNTER — Encounter (HOSPITAL_COMMUNITY): Payer: Self-pay | Admitting: Emergency Medicine

## 2020-06-16 DIAGNOSIS — D689 Coagulation defect, unspecified: Secondary | ICD-10-CM | POA: Diagnosis not present

## 2020-06-16 DIAGNOSIS — N179 Acute kidney failure, unspecified: Secondary | ICD-10-CM | POA: Diagnosis not present

## 2020-06-16 DIAGNOSIS — A419 Sepsis, unspecified organism: Secondary | ICD-10-CM | POA: Diagnosis not present

## 2020-06-16 DIAGNOSIS — E876 Hypokalemia: Secondary | ICD-10-CM | POA: Diagnosis present

## 2020-06-16 DIAGNOSIS — K219 Gastro-esophageal reflux disease without esophagitis: Secondary | ICD-10-CM | POA: Diagnosis not present

## 2020-06-16 DIAGNOSIS — I998 Other disorder of circulatory system: Secondary | ICD-10-CM | POA: Diagnosis present

## 2020-06-16 DIAGNOSIS — K55029 Acute infarction of small intestine, extent unspecified: Secondary | ICD-10-CM | POA: Diagnosis not present

## 2020-06-16 DIAGNOSIS — I745 Embolism and thrombosis of iliac artery: Secondary | ICD-10-CM | POA: Diagnosis not present

## 2020-06-16 DIAGNOSIS — Z515 Encounter for palliative care: Secondary | ICD-10-CM | POA: Diagnosis not present

## 2020-06-16 DIAGNOSIS — T82856A Stenosis of peripheral vascular stent, initial encounter: Principal | ICD-10-CM | POA: Diagnosis present

## 2020-06-16 DIAGNOSIS — K55059 Acute (reversible) ischemia of intestine, part and extent unspecified: Secondary | ICD-10-CM

## 2020-06-16 DIAGNOSIS — Y712 Prosthetic and other implants, materials and accessory cardiovascular devices associated with adverse incidents: Secondary | ICD-10-CM | POA: Diagnosis not present

## 2020-06-16 DIAGNOSIS — Z8249 Family history of ischemic heart disease and other diseases of the circulatory system: Secondary | ICD-10-CM

## 2020-06-16 DIAGNOSIS — E7800 Pure hypercholesterolemia, unspecified: Secondary | ICD-10-CM | POA: Diagnosis not present

## 2020-06-16 DIAGNOSIS — Z452 Encounter for adjustment and management of vascular access device: Secondary | ICD-10-CM

## 2020-06-16 DIAGNOSIS — F17210 Nicotine dependence, cigarettes, uncomplicated: Secondary | ICD-10-CM | POA: Diagnosis present

## 2020-06-16 DIAGNOSIS — H5462 Unqualified visual loss, left eye, normal vision right eye: Secondary | ICD-10-CM | POA: Diagnosis present

## 2020-06-16 DIAGNOSIS — I160 Hypertensive urgency: Secondary | ICD-10-CM

## 2020-06-16 DIAGNOSIS — I7409 Other arterial embolism and thrombosis of abdominal aorta: Secondary | ICD-10-CM | POA: Diagnosis present

## 2020-06-16 DIAGNOSIS — J189 Pneumonia, unspecified organism: Secondary | ICD-10-CM | POA: Diagnosis not present

## 2020-06-16 DIAGNOSIS — K7200 Acute and subacute hepatic failure without coma: Secondary | ICD-10-CM | POA: Diagnosis not present

## 2020-06-16 DIAGNOSIS — K551 Chronic vascular disorders of intestine: Secondary | ICD-10-CM | POA: Diagnosis present

## 2020-06-16 DIAGNOSIS — I10 Essential (primary) hypertension: Secondary | ICD-10-CM | POA: Diagnosis present

## 2020-06-16 DIAGNOSIS — Z7902 Long term (current) use of antithrombotics/antiplatelets: Secondary | ICD-10-CM

## 2020-06-16 DIAGNOSIS — E860 Dehydration: Secondary | ICD-10-CM

## 2020-06-16 DIAGNOSIS — Z681 Body mass index (BMI) 19 or less, adult: Secondary | ICD-10-CM | POA: Diagnosis not present

## 2020-06-16 DIAGNOSIS — M79606 Pain in leg, unspecified: Secondary | ICD-10-CM | POA: Diagnosis present

## 2020-06-16 DIAGNOSIS — R079 Chest pain, unspecified: Secondary | ICD-10-CM

## 2020-06-16 DIAGNOSIS — Z4659 Encounter for fitting and adjustment of other gastrointestinal appliance and device: Secondary | ICD-10-CM

## 2020-06-16 DIAGNOSIS — Z66 Do not resuscitate: Secondary | ICD-10-CM | POA: Diagnosis not present

## 2020-06-16 DIAGNOSIS — Z955 Presence of coronary angioplasty implant and graft: Secondary | ICD-10-CM

## 2020-06-16 DIAGNOSIS — E162 Hypoglycemia, unspecified: Secondary | ICD-10-CM | POA: Diagnosis not present

## 2020-06-16 DIAGNOSIS — Z79899 Other long term (current) drug therapy: Secondary | ICD-10-CM

## 2020-06-16 DIAGNOSIS — R1110 Vomiting, unspecified: Secondary | ICD-10-CM

## 2020-06-16 DIAGNOSIS — Z20822 Contact with and (suspected) exposure to covid-19: Secondary | ICD-10-CM | POA: Diagnosis not present

## 2020-06-16 DIAGNOSIS — R680 Hypothermia, not associated with low environmental temperature: Secondary | ICD-10-CM | POA: Diagnosis not present

## 2020-06-16 DIAGNOSIS — I2510 Atherosclerotic heart disease of native coronary artery without angina pectoris: Secondary | ICD-10-CM | POA: Diagnosis not present

## 2020-06-16 DIAGNOSIS — R6521 Severe sepsis with septic shock: Secondary | ICD-10-CM | POA: Diagnosis not present

## 2020-06-16 DIAGNOSIS — R0602 Shortness of breath: Secondary | ICD-10-CM

## 2020-06-16 DIAGNOSIS — E782 Mixed hyperlipidemia: Secondary | ICD-10-CM | POA: Diagnosis not present

## 2020-06-16 DIAGNOSIS — Z8673 Personal history of transient ischemic attack (TIA), and cerebral infarction without residual deficits: Secondary | ICD-10-CM

## 2020-06-16 DIAGNOSIS — Z91128 Patient's intentional underdosing of medication regimen for other reason: Secondary | ICD-10-CM

## 2020-06-16 DIAGNOSIS — I70223 Atherosclerosis of native arteries of extremities with rest pain, bilateral legs: Secondary | ICD-10-CM | POA: Diagnosis present

## 2020-06-16 DIAGNOSIS — E440 Moderate protein-calorie malnutrition: Secondary | ICD-10-CM | POA: Diagnosis not present

## 2020-06-16 DIAGNOSIS — Z888 Allergy status to other drugs, medicaments and biological substances status: Secondary | ICD-10-CM

## 2020-06-16 DIAGNOSIS — I779 Disorder of arteries and arterioles, unspecified: Secondary | ICD-10-CM

## 2020-06-16 LAB — COMPREHENSIVE METABOLIC PANEL
ALT: 16 U/L (ref 0–44)
AST: 19 U/L (ref 15–41)
Albumin: 3.5 g/dL (ref 3.5–5.0)
Alkaline Phosphatase: 98 U/L (ref 38–126)
Anion gap: 15 (ref 5–15)
BUN: 13 mg/dL (ref 8–23)
CO2: 26 mmol/L (ref 22–32)
Calcium: 9.5 mg/dL (ref 8.9–10.3)
Chloride: 98 mmol/L (ref 98–111)
Creatinine, Ser: 0.58 mg/dL (ref 0.44–1.00)
GFR, Estimated: >60 mL/min (ref >60)
Glucose, Bld: 126 mg/dL — ABNORMAL HIGH (ref 70–99)
Potassium: 3.6 mmol/L (ref 3.5–5.1)
Sodium: 139 mmol/L (ref 135–145)
Total Bilirubin: 1 mg/dL (ref 0.3–1.2)
Total Protein: 8.3 g/dL — ABNORMAL HIGH (ref 6.5–8.1)

## 2020-06-16 LAB — RESP PANEL BY RT-PCR (FLU A&B, COVID) ARPGX2
Influenza A by PCR: NEGATIVE
Influenza B by PCR: NEGATIVE
SARS Coronavirus 2 by RT PCR: NEGATIVE

## 2020-06-16 LAB — CBC WITH DIFFERENTIAL/PLATELET
Abs Immature Granulocytes: 0.08 10*3/uL — ABNORMAL HIGH (ref 0.00–0.07)
Basophils Absolute: 0 10*3/uL (ref 0.0–0.1)
Basophils Relative: 0 %
Eosinophils Absolute: 0 10*3/uL (ref 0.0–0.5)
Eosinophils Relative: 0 %
HCT: 44.6 % (ref 36.0–46.0)
Hemoglobin: 14.9 g/dL (ref 12.0–15.0)
Immature Granulocytes: 1 %
Lymphocytes Relative: 6 %
Lymphs Abs: 1 10*3/uL (ref 0.7–4.0)
MCH: 28.7 pg (ref 26.0–34.0)
MCHC: 33.4 g/dL (ref 30.0–36.0)
MCV: 85.9 fL (ref 80.0–100.0)
Monocytes Absolute: 1.2 10*3/uL — ABNORMAL HIGH (ref 0.1–1.0)
Monocytes Relative: 8 %
Neutro Abs: 13.8 10*3/uL — ABNORMAL HIGH (ref 1.7–7.7)
Neutrophils Relative %: 85 %
Platelets: 334 10*3/uL (ref 150–400)
RBC: 5.19 MIL/uL — ABNORMAL HIGH (ref 3.87–5.11)
RDW: 13.3 % (ref 11.5–15.5)
WBC: 16 10*3/uL — ABNORMAL HIGH (ref 4.0–10.5)
nRBC: 0 % (ref 0.0–0.2)

## 2020-06-16 LAB — URINALYSIS, ROUTINE W REFLEX MICROSCOPIC
Bilirubin Urine: NEGATIVE
Glucose, UA: NEGATIVE mg/dL
Ketones, ur: NEGATIVE mg/dL
Leukocytes,Ua: NEGATIVE
Nitrite: NEGATIVE
Protein, ur: 30 mg/dL — AB
Specific Gravity, Urine: 1.008 (ref 1.005–1.030)
pH: 6 (ref 5.0–8.0)

## 2020-06-16 LAB — RAPID URINE DRUG SCREEN, HOSP PERFORMED
Amphetamines: NOT DETECTED
Barbiturates: NOT DETECTED
Benzodiazepines: NOT DETECTED
Cocaine: NOT DETECTED
Opiates: NOT DETECTED
Tetrahydrocannabinol: POSITIVE — AB

## 2020-06-16 LAB — LACTIC ACID, PLASMA: Lactic Acid, Venous: 1.8 mmol/L (ref 0.5–1.9)

## 2020-06-16 LAB — CBG MONITORING, ED: Glucose-Capillary: 126 mg/dL — ABNORMAL HIGH (ref 70–99)

## 2020-06-16 LAB — PROTIME-INR
INR: 1.3 — ABNORMAL HIGH (ref 0.8–1.2)
Prothrombin Time: 15.5 seconds — ABNORMAL HIGH (ref 11.4–15.2)

## 2020-06-16 LAB — APTT: aPTT: 38 seconds — ABNORMAL HIGH (ref 24–36)

## 2020-06-16 MED ORDER — HYDRALAZINE HCL 20 MG/ML IJ SOLN
20.0000 mg | Freq: Once | INTRAMUSCULAR | Status: AC
  Administered 2020-06-16: 20 mg via INTRAVENOUS
  Filled 2020-06-16: qty 1

## 2020-06-16 MED ORDER — LOSARTAN POTASSIUM 50 MG PO TABS
50.0000 mg | ORAL_TABLET | Freq: Every day | ORAL | Status: DC
  Administered 2020-06-17: 50 mg via ORAL
  Filled 2020-06-16: qty 1

## 2020-06-16 MED ORDER — MORPHINE SULFATE (PF) 2 MG/ML IV SOLN
2.0000 mg | INTRAVENOUS | Status: DC | PRN
  Administered 2020-06-17 – 2020-06-19 (×3): 2 mg via INTRAVENOUS
  Filled 2020-06-16 (×4): qty 1

## 2020-06-16 MED ORDER — HEPARIN BOLUS VIA INFUSION
1500.0000 [IU] | Freq: Once | INTRAVENOUS | Status: AC
  Administered 2020-06-16: 1500 [IU] via INTRAVENOUS
  Filled 2020-06-16: qty 1500

## 2020-06-16 MED ORDER — ACETAMINOPHEN 650 MG RE SUPP: 650.0000 mg | Freq: Four times a day (QID) | RECTAL | Status: DC | PRN

## 2020-06-16 MED ORDER — HEPARIN BOLUS VIA INFUSION
1500.0000 [IU] | Freq: Once | INTRAVENOUS | Status: DC
  Filled 2020-06-16: qty 1500

## 2020-06-16 MED ORDER — ONDANSETRON HCL 4 MG/2ML IJ SOLN
4.0000 mg | Freq: Four times a day (QID) | INTRAMUSCULAR | Status: DC | PRN
  Administered 2020-06-17 – 2020-06-18 (×5): 4 mg via INTRAVENOUS
  Filled 2020-06-16 (×5): qty 2

## 2020-06-16 MED ORDER — LABETALOL HCL 5 MG/ML IV SOLN
20.0000 mg | Freq: Once | INTRAVENOUS | Status: AC
  Administered 2020-06-16: 20 mg via INTRAVENOUS
  Filled 2020-06-16: qty 4

## 2020-06-16 MED ORDER — PANTOPRAZOLE SODIUM 40 MG PO TBEC
40.0000 mg | DELAYED_RELEASE_TABLET | Freq: Every day | ORAL | Status: DC
  Administered 2020-06-17 – 2020-06-18 (×2): 40 mg via ORAL
  Filled 2020-06-16 (×2): qty 1

## 2020-06-16 MED ORDER — POLYETHYLENE GLYCOL 3350 17 G PO PACK: 17.0000 g | PACK | Freq: Every day | ORAL | Status: DC | PRN

## 2020-06-16 MED ORDER — ATORVASTATIN CALCIUM 40 MG PO TABS
40.0000 mg | ORAL_TABLET | Freq: Every day | ORAL | Status: DC
  Administered 2020-06-17 – 2020-06-18 (×2): 40 mg via ORAL
  Filled 2020-06-16: qty 4
  Filled 2020-06-16: qty 1

## 2020-06-16 MED ORDER — LABETALOL HCL 5 MG/ML IV SOLN: 10.0000 mg | Freq: Once | INTRAVENOUS | Status: DC

## 2020-06-16 MED ORDER — AMLODIPINE BESYLATE 5 MG PO TABS
5.0000 mg | ORAL_TABLET | Freq: Every day | ORAL | Status: DC
  Administered 2020-06-16 – 2020-06-17 (×2): 5 mg via ORAL
  Filled 2020-06-16 (×2): qty 1

## 2020-06-16 MED ORDER — HEPARIN (PORCINE) 25000 UT/250ML-% IV SOLN
800.0000 [IU]/h | INTRAVENOUS | Status: DC
  Administered 2020-06-16: 800 [IU]/h via INTRAVENOUS
  Filled 2020-06-16 (×2): qty 250

## 2020-06-16 MED ORDER — HYDRALAZINE HCL 20 MG/ML IJ SOLN
10.0000 mg | Freq: Four times a day (QID) | INTRAMUSCULAR | Status: DC | PRN
  Administered 2020-06-16 – 2020-06-17 (×3): 10 mg via INTRAVENOUS
  Filled 2020-06-16 (×3): qty 1

## 2020-06-16 MED ORDER — OXYCODONE-ACETAMINOPHEN 5-325 MG PO TABS
1.0000 | ORAL_TABLET | ORAL | Status: DC | PRN
  Administered 2020-06-17 – 2020-06-18 (×4): 1 via ORAL
  Filled 2020-06-16 (×4): qty 1

## 2020-06-16 MED ORDER — IOHEXOL 350 MG/ML SOLN
100.0000 mL | Freq: Once | INTRAVENOUS | Status: AC | PRN
  Administered 2020-06-16: 100 mL via INTRAVENOUS

## 2020-06-16 MED ORDER — HEPARIN (PORCINE) 25000 UT/250ML-% IV SOLN
800.0000 [IU]/h | INTRAVENOUS | Status: DC
  Filled 2020-06-16: qty 250

## 2020-06-16 MED ORDER — CARVEDILOL 6.25 MG PO TABS
6.2500 mg | ORAL_TABLET | Freq: Two times a day (BID) | ORAL | Status: DC
Start: 2020-06-17 — End: 2020-06-17
  Administered 2020-06-17: 6.25 mg via ORAL
  Filled 2020-06-16: qty 2

## 2020-06-16 MED ORDER — ONDANSETRON HCL 4 MG PO TABS
4.0000 mg | ORAL_TABLET | Freq: Four times a day (QID) | ORAL | Status: DC | PRN
  Filled 2020-06-16: qty 1

## 2020-06-16 MED ORDER — ACETAMINOPHEN 325 MG PO TABS: 650.0000 mg | ORAL_TABLET | Freq: Four times a day (QID) | ORAL | Status: DC | PRN

## 2020-06-16 MED ORDER — SODIUM CHLORIDE 0.9 % IV BOLUS
1000.0000 mL | Freq: Once | INTRAVENOUS | Status: AC
  Administered 2020-06-16: 1000 mL via INTRAVENOUS

## 2020-06-16 NOTE — ED Notes (Addendum)
Patient attached to external female catheter/ clean/ dry / intact 

## 2020-06-16 NOTE — ED Notes (Signed)
Pt brother at bedside at this time.

## 2020-06-16 NOTE — ED Triage Notes (Signed)
Pt BIB [facility] with c/o generalized weakness and back pain. Per EMS, pt reports difficulty walking and neuropathy in both lower extremities. Initial BP was 250/160 and HR in the 140's.  Pt also reports decreased oral intake since end of January.

## 2020-06-16 NOTE — ED Notes (Signed)
Report called to [facility] ED.  

## 2020-06-16 NOTE — ED Notes (Signed)
Pt arrived from [facility] d/t occlusion in iliac artery and vascular has been paged. Pt is A/Ox4 upon arrival c/o 5/10 pain in both of her legs.

## 2020-06-16 NOTE — Progress Notes (Signed)
ANTICOAGULATION CONSULT NOTE - Initial Consult  Pharmacy Consult for heparin infusion Indication: arterial occlusion  Allergies  Allergen Reactions  . Lisinopril Cough  . Lovastatin Cough    Patient Measurements: Height: 5\' 1"  (154.9 cm) Weight: 45.4 kg (100 lb) IBW/kg (Calculated) : 47.8 Heparin Dosing Weight: 45.4 kg  Vital Signs: Temp: 98.8 F (37.1 C) (02/22 1506) Temp Source: Oral (02/22 1506) BP: 159/98 (02/22 1506) Pulse Rate: 95 (02/22 1506)  Labs: Recent Labs    06/05/2020 1112  HGB 14.9  HCT 44.6  PLT 334  CREATININE 0.58    Estimated Creatinine Clearance: 50.2 mL/min (by C-G formula based on SCr of 0.58 mg/dL).   Medical History: Past Medical History:  Diagnosis Date  . Blind left eye   . Coronary artery disease   . GERD (gastroesophageal reflux disease)   . Hyperlipidemia   . Hypertension   . PAD (peripheral artery disease) (HCC)   . Stroke [facility]) 1997    Medications:  No PTA anticoagulation  Assessment: Pharmacy consulted to dose heparin for arterial occlusion. Hgb and PLT WNL.  Goal of Therapy:  Heparin level 0.3-0.7 units/ml Monitor platelets by anticoagulation protocol: Yes   Plan:  Obtain baseline aPTT and INR Heparin 1500 units IV bolus then heparin infusion at 800 units/hr 6 hour heparin level Monitor daily heparin level, CBC, s/s bleeding   1998, PharmD, BCPS 06/05/2020,3:13 PM

## 2020-06-16 NOTE — ED Notes (Signed)
Dr Dixon at bedside.

## 2020-06-16 NOTE — H&P (Addendum)
History and Physical    Cindy Hess BJY:[id number] DOB: Jan 21, 1956 DOA: 05/31/2020  PCP: Patient, No Pcp Per  Patient coming from: [facility] emergency department   Chief Complaint:  Chief Complaint  Patient presents with  . Weakness     HPI:    65 year old female with past medical history of hypertension, previous CVA, left eye blindness, nicotine dependence, gastroesophageal reflux disease, coronary artery disease and an extensive history of peripheral vascular disease status post multiple interventions who presents to [facility] emergency department for evaluation of right lower extremity weakness and pain.  Patient explains that for the past several years (approx 2017) she has experienced gradual progression of her known advanced peripheral vascular disease.  This is resulted in progressively worsening bilateral lower extremity pain with ambulation.    Patient has undergone multiple vascular interventions including right femoral endarterectomy, right common iliac stenting, right external iliac as well as stenting of the left iliofemoral system.  Patient has also been diagnosed with substantial celiac artery stenosis and chronic mesenteric ischemia.  Review of care everywhere reveals that the patient has not been consistently following up with her outpatient providers since 2018.  Patient admits to not taking any antiplatelet or antihypertensive therapy in quite a while.  Patient has been continuing to smoke over the span of time and states that she only recently quit smoking 3 days ago.  For approximately the past 1 to 2 months patient has experienced substantial right lower extremity pain.  Patient describes his pain as cold as well as "pins-and-needles" in quality.  Discomfort is severe in intensity, worse with exertion or weightbearing.  As patient's discomfort has progressively worsened this is also been associated with weakness particularly of the right lower extremity.   Patient states that she frequently has to crawl around her apartment to get around due to degree of pain and weakness she is experiencing.  Patient eventually presented to [facility] emergency department with these complaints.  CT angiogram with aortobifemoral runoff revealed total occlusion of the common iliac artery, chronic occlusion of the distal right superficial artery, moderate stenosis of the infrarenal abdominal aorta, moderate stenosis of the proximal left common iliac artery and focal occlusion of the distal left external iliac artery.  Patient was initiated on a heparin infusion.  These extensive CT findings were discussed with vascular surgery and it was recommended that the patient be transferred from [facility] emergency department to [facility] emergency department for urgent evaluation.  Patient was evaluated by Dr. Edilia Bo with vascular surgery who feels that revascularization would be beneficial for this patient but is requesting that medicine admit for medical optimization, particularly of her uncontrolled blood pressures prior to considering such procedures.  The hospitalist group has now been called to assess the patient for admission the hospital.  Review of systems:   Review of Systems  Constitutional: Positive for malaise/fatigue and weight loss.  Musculoskeletal: Positive for falls, joint pain and myalgias.  Neurological: Positive for weakness.  All other systems reviewed and are negative.   Past Medical History:  Diagnosis Date  . Blind left eye   . Coronary artery disease   . GERD (gastroesophageal reflux disease)   . Hyperlipidemia   . Hypertension   . PAD (peripheral artery disease) (HCC)   . Stroke [facility]) 1997    Past Surgical History:  Procedure Laterality Date  . CORONARY ANGIOPLASTY WITH STENT PLACEMENT     3 stent     reports that she has  been smoking cigarettes. She has a 0.75 pack-year smoking history. She has never used smokeless  tobacco. She reports current alcohol use. She reports previous drug use.  Allergies  Allergen Reactions  . Lisinopril Cough  . Lovastatin Cough    Family History  Problem Relation Age of Onset  . Hypertension Mother      Prior to Admission medications   Medication Sig Start Date End Date Taking? Authorizing Provider  NEURONTIN 100 MG capsule Take 100 mg by mouth 3 (three) times daily. 02/10/20  Yes [provider]  amLODipine (NORVASC) 10 MG tablet Take 1 tablet by mouth once daily Patient not taking: Reported on 30-Jun-2020 07/25/18   Claiborne Rigg, NP  atorvastatin (LIPITOR) 40 MG tablet Take 1 tablet (40 mg total) by mouth daily. Patient not taking: Reported on 06/30/20 12/20/17 03/20/18  Claiborne Rigg, NP  clopidogrel (PLAVIX) 75 MG tablet Take 1 tablet (75 mg total) by mouth daily. Patient not taking: Reported on 2020-06-30 12/20/17   Claiborne Rigg, NP  losartan (COZAAR) 100 MG tablet Take 1 tablet (100 mg total) by mouth daily. Patient not taking: Reported on Jun 30, 2020 12/20/17 03/20/18  Claiborne Rigg, NP  metoprolol succinate (TOPROL-XL) 50 MG 24 hr tablet Take 1 tablet (50 mg total) by mouth daily. Take with or immediately following a meal. Patient not taking: Reported on June 30, 2020 02/14/18 05/15/18  Claiborne Rigg, NP  ondansetron (ZOFRAN) 4 MG tablet Take 1 tablet (4 mg total) by mouth every 8 (eight) hours as needed for nausea or vomiting. Patient not taking: Reported on 2020/06/30 08/16/18   Mesner, Barbara Cower, MD  pantoprazole (PROTONIX) 20 MG tablet Take 1 tablet (20 mg total) by mouth daily. Patient not taking: Reported on June 30, 2020 12/20/17 03/20/18  Claiborne Rigg, NP    Physical Exam: Vitals:   Jun 30, 2020 1745 2020/06/30 2015 06-30-2020 2045 2020/06/30 2100  BP: (!) 165/108 (!) 191/117 (!) 177/115 (!) 188/118  Pulse: (!) 111 (!) 103 94 98  Resp: 17 20 (!) 36 20  Temp:      TempSrc:      SpO2: (!) 46% 99% 99% 99%  Weight:      Height:         Constitutional: Acute alert and oriented x3, patient is in mild distress due to pain.   Skin: no rashes, no lesions, poor skin turgor noted. Eyes: Pupils are equally reactive to light.  No evidence of scleral icterus or conjunctival pallor.  ENMT: Moist mucous membranes noted.  Posterior pharynx clear of any exudate or lesions.  Neck: normal, supple, no masses, no thyromegaly.  No evidence of jugular venous distension.   Respiratory: clear to auscultation bilaterally, no wheezing, no crackles. Normal respiratory effort. No accessory muscle use.  Cardiovascular: Regular rate and rhythm, no murmurs / rubs / gallops. No extremity edema.  Absent pulses at the distal right lower extremity.  Faint pulses of the distal left lower extremity.  No carotid bruits.  Chest:   Nontender without crepitus or deformity.   Back:   Nontender without crepitus or deformity. Abdomen: Notable generalized abdominal tenderness.  Abdomen is soft however.  No evidence of intra-abdominal masses.  Positive bowel sounds noted in all quadrants.   Musculoskeletal: Significant tenderness of the distal right lower extremity without evidence of joint deformity.  Notably cold right lower extremity.  Good ROM, no contractures.  Poor muscle tone noted. Neurologic: CN 2-12 grossly intact. Sensation intact.  Patient moving all 4 extremities spontaneously.  Patient  is following all commands.  Patient is responsive to verbal stimuli.   Psychiatric: Patient exhibits normal mood with appropriate affect.  Patient seems to possess insight as to their current situation.     Labs on Admission: I have personally reviewed following labs and imaging studies -   CBC: Recent Labs  Lab 2020-06-27 1112  WBC 16.0*  NEUTROABS 13.8*  HGB 14.9  HCT 44.6  MCV 85.9  PLT 334   Basic Metabolic Panel: Recent Labs  Lab 06/27/20 1112  NA 139  K 3.6  CL 98  CO2 26  GLUCOSE 126*  BUN 13  CREATININE 0.58  CALCIUM 9.5   GFR: Estimated  Creatinine Clearance: 50.2 mL/min (by C-G formula based on SCr of 0.58 mg/dL). Liver Function Tests: Recent Labs  Lab 2020-06-27 1112  AST 19  ALT 16  ALKPHOS 98  BILITOT 1.0  PROT 8.3*  ALBUMIN 3.5   No results for input(s): LIPASE, AMYLASE in the last 168 hours. No results for input(s): AMMONIA in the last 168 hours. Coagulation Profile: Recent Labs  Lab 27-Jun-2020 1112  INR 1.3*   Cardiac Enzymes: No results for input(s): CKTOTAL, CKMB, CKMBINDEX, TROPONINI in the last 168 hours. BNP (last 3 results) No results for input(s): PROBNP in the last 8760 hours. HbA1C: No results for input(s): HGBA1C in the last 72 hours. CBG: Recent Labs  Lab 2020-06-27 1114  GLUCAP 126*   Lipid Profile: No results for input(s): CHOL, HDL, LDLCALC, TRIG, CHOLHDL, LDLDIRECT in the last 72 hours. Thyroid Function Tests: No results for input(s): TSH, T4TOTAL, FREET4, T3FREE, THYROIDAB in the last 72 hours. Anemia Panel: No results for input(s): VITAMINB12, FOLATE, FERRITIN, TIBC, IRON, RETICCTPCT in the last 72 hours. Urine analysis:    Component Value Date/Time   COLORURINE YELLOW Jun 27, 2020 1129   APPEARANCEUR CLEAR 06/27/20 1129   LABSPEC 1.008 2020-06-27 1129   PHURINE 6.0 06-27-2020 1129   GLUCOSEU NEGATIVE 2020-06-27 1129   HGBUR SMALL (A) 06-27-2020 1129   BILIRUBINUR NEGATIVE 06/27/2020 1129   KETONESUR NEGATIVE Jun 27, 2020 1129   PROTEINUR 30 (A) 06/27/2020 1129   NITRITE NEGATIVE 27-Jun-2020 1129   LEUKOCYTESUR NEGATIVE 2020/06/27 1129    Radiological Exams on Admission - Personally Reviewed: DG Chest 2 View  Result Date: 06-27-20 CLINICAL DATA:  65 year old female with generalized weakness and back pain EXAM: CHEST - 2 VIEW COMPARISON:  None. FINDINGS: The heart size and mediastinal contours are within normal limits. Nipple shadows overlie the bilateral lungs. No focal consolidation. No pleural effusion. No pneumothorax. The visualized skeletal structures are unremarkable.  IMPRESSION: No active cardiopulmonary disease. Electronically Signed   By: Maudry Mayhew MD   On: 06/27/2020 11:47   CT Head Wo Contrast  Result Date: 27-Jun-2020 CLINICAL DATA:  65 year old female with weakness, back pain, mental status change. Hypertensive and tachycardic at presentation. EXAM: CT HEAD WITHOUT CONTRAST TECHNIQUE: Contiguous axial images were obtained from the base of the skull through the vertex without intravenous contrast. COMPARISON:  None. FINDINGS: Brain: Confluent dystrophic calcification in the bilateral basal ganglia, deep cerebellar nuclei. No midline shift, ventriculomegaly, mass effect, evidence of mass lesion, intracranial hemorrhage or evidence of cortically based acute infarction. Mild for age patchy white matter hypodensity in the superior hemispheres. No cortical encephalomalacia identified. Vascular: Calcified atherosclerosis at the skull base. No suspicious intracranial vascular hyperdensity. Skull: Negative. Sinuses/Orbits: Visualized paranasal sinuses and mastoids are clear. Other: No acute orbit or scalp soft tissue finding. IMPRESSION: 1. No acute intracranial abnormality. 2. 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 3. Mild for age nonspecific white matter changes. Electronically Signed   By: Odessa Fleming M.D.   On: 06/10/2020 11:55   CT Angio Aortobifemoral W and/or Wo Contrast  Result Date: 06/02/2020 CLINICAL DATA:  65 year old female with generalized weakness and back pain. EXAM: CT ANGIOGRAPHY OF ABDOMINAL AORTA WITH ILIOFEMORAL RUNOFF TECHNIQUE: Multidetector CT imaging of the abdomen, pelvis and lower extremities was performed using the standard protocol during bolus administration of intravenous contrast. Multiplanar CT image reconstructions and MIPs were obtained to evaluate the vascular anatomy. CONTRAST:  OMNIPAQUE IOHEXOL 350 MG/ML SOLN COMPARISON:  08/16/2018 FINDINGS: VASCULAR Aorta:  Normal caliber. Severe fibrofatty and calcific atherosclerotic changes, most prominent in the distal aorta where there is near circumferential mural plaque resulting in approximately 75% stenosis in the distal aorta. Celiac: Occluded. There is distal reconstitution via the GDA to supply the hepatic, left gastric, and splenic arteries. SMA: Occluded proximally.  Distal reconstitution. Renals: Moderate ostial stenosis of the single right renal artery secondary to atherosclerotic plaque. Mild stenosis of the proximal single left renal artery secondary to atherosclerotic plaque. IMA: Severe ostial stenosis secondary to atherosclerotic plaque. Patent distally including the arc of Riolan. RIGHT Lower Extremity Inflow: Total occlusion of the common iliac artery and indwelling common to external iliac artery stent. Distal reconstitution in the distal external iliac artery. Outflow: Common femoral artery is patent proximally with severe distal stenosis secondary to atherosclerotic plaque. The profunda is patent but diminutive. The superficial femoral artery is diminutive proximally, gradually tapering to occlusion at the level of Hunter's canal read there is prominent atherosclerotic calcification. There is distal reconstitution in the distal P1 segment of the popliteal which is diminutive but patent throughout Runoff: Patent trifurcation proximally, each runoff vessel diminutive distally, limited evaluation. LEFT Lower Extremity Inflow: Severe multifocal stenoses of the left common iliac artery secondary to near circumferential and irregular atherosclerotic calcifications. The proximal internal iliac artery is occluded with distal reconstitution. The indwelling left common external iliac artery stent is patent. There is a focal occlusion in the native distal external iliac artery just beyond the distal aspect of the indwelling external iliac artery stent. There is distal reconstitution into the common femoral artery which  is diminutive with advanced atherosclerotic calcifications. Outflow: The profunda is patent. The SFA his diminutive throughout with multifocal at least moderate stenosis secondary to atherosclerotic plaque. The popliteal artery is patent but diminutive. Runoff: Patent trifurcation. There is definite inline flow via the anterior tibial artery. The peroneal appears patent to the level of the ankle. The posterior tibial artery tapers proximally without definite inline flow to the foot. Veins: No obvious venous abnormality within the limitations of this arterial phase study. Review of the MIP images confirms the above findings. NON-VASCULAR Lower chest: No acute abnormality. Hepatobiliary: No focal liver abnormality is seen. No gallstones, gallbladder wall thickening, or biliary dilatation. Pancreas: Unremarkable. No pancreatic ductal dilatation or surrounding inflammatory changes. Spleen: Normal in size without focal abnormality. Adrenals/Urinary Tract: Adrenal glands are unremarkable. Kidneys are normal, without renal calculi, focal lesion, or hydronephrosis. Bladder is unremarkable. Stomach/Bowel: Stomach is distended with enteric contents. Appendix is not definitively visualized. Moderate to large volume stool burden. The sigmoid colon is redundant. No evidence of bowel wall thickening, distention, or inflammatory changes. Lymphatic: No abdominopelvic lymphadenopathy. Reproductive: Uterus and bilateral adnexa are unremarkable. Other: Tiny fat containing umbilical hernia.  No ascites. Musculoskeletal: No acute or significant osseous findings. IMPRESSION: VASCULAR  1. Total occlusion of the common iliac artery and indwelling common to external iliac artery stent. Distal reconstitution in the distal external iliac artery. Short segment chronic occlusion of the distal right superficial artery with reconstitution in the proximal popliteal artery. Patent right trifurcation proximally, limited evaluation of the runoff  vessels distally. 2. 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 There is at least moderate stenosis of the native proximal left common iliac artery. The indwelling common external iliac artery stent is patent. Focal occlusion about the native distal left external iliac artery, just distal to the indwelling stent. Diminutive left SFA with at least moderate multifocal stenoses. Patent left trifurcation with inline flow to the foot via the anterior tibial artery. 3. Severe ostial stenosis of the inferior mesenteric artery secondary to atherosclerotic plaque. Unchanged chronic ostial occlusions of the celiac and superior mesenteric arteries. Patent arc of Riolan. NON-VASCULAR 1. Distended stomach with enteric contents. 2. Moderate to large stool burden. Marliss Cootsylan Suttle, MD [facility] Electronically Signed   By: Marliss Cootsylan  Suttle MD   On: 06/15/2020 14:36    EKG: Personally reviewed.  Rhythm is sinus tachycardia with heart rate of 108 bpm.  No dynamic ST segment changes appreciated.  Assessment/Plan Principal Problem:   Hypertensive urgency   Patient exhibiting profoundly elevated blood pressures in the emergency department as high as 232/141  While there is no evidence of acute endorgan injury, blood pressures must be optimized for vascular surgery to consider intervention on this patient  Outpatient primary care notes reviewed from 2018.  Patient has achieved control of her blood pressures with a regimen of losartan, labetalol and amlodipine in the past.  Will place patient on modest regimen of Coreg in combination with moderate doses of amlodipine and losartan.   Target blood pressure reduction of approximately 25% in the first 24 hours followed by further medication titration.   As needed intravenous antihypertensives for markedly elevated blood pressures.     Monitoring patient on telemetry in the progressive unit  Active Problems:   Occlusion of right iliac artery (HCC)   CT imaging revealed total occlusion of the right common iliac artery and indwelling common to external iliac artery stent with additional chronic occlusion of the distal right superficial artery   Patient has been evaluated by Dr. Edilia Boickson in the emergency department -patient will be considered for intervention once blood pressures dramatically improved  Continuing heparin infusion  We will touch base with vascular surgery once blood pressures have shown improvement    Nicotine dependence, cigarettes, uncomplicated   Counseling patient on cessation daily    Coronary artery disease involving native coronary artery of native heart without angina pectoris   Several notes make mention of a history of coronary artery disease although I am unable to obtain further detail  Patient is currently chest pain-free  No evidence of dynamic ST segment change on EKG  Monitoring patient on telemetry     Mixed hyperlipidemia   We will resume regimen of statin patient has been on in the past, Lipitor 40 mg daily  Obtaining lipid panel    GERD without esophagitis    Protonix 40 mg daily   Code Status:  Full code Family Communication: deferred   Status is: Observation  The patient remains OBS appropriate and will d/c before 2 midnights.  Dispo: The patient is from: Home  Anticipated d/c is to: Home              Anticipated d/c date is: 2 days              Patient currently is not medically stable to d/c.   Difficult to place patient No        Marinda Elk MD [facility] Pager [phone]  If 7PM-7AM, please contact night-coverage [email or web address] Use universal [place] password for that web site. If you do not have the password, please call the hospital operator.  05/31/2020, 10:36 PM

## 2020-06-16 NOTE — ED Notes (Signed)
Pt taken to CT at this time.

## 2020-06-16 NOTE — Consult Note (Signed)
REASON FOR CONSULT:    Leg weakness.  The consult was requested by the [facility] emergency department.  ASSESSMENT & PLAN:   AORTOILIAC OCCLUSIVE DISEASE: This patient has had extensive previous vascular surgery in [facility] including right femoral endarterectomy and right common iliac and external iliac artery stenting.  In addition she had stenting of the left iliac system.  She has had symptoms for a month now and presented to the emergency department.  I do not think anything is happened acutely in the last few weeks.  Regardless, she has an occluded right common iliac and external iliac artery stent and occluded left external iliac artery stent.  She has a reasonable Doppler signal on the left but no Doppler flow in the right foot.  She states that the right foot has been numb since January 29 when she went out in the cold without shoes.  She has had a hard time moving her leg since that time also.  Her only option for revascularization would be aortofemoral bypass grafting versus axillobifemoral bypass grafting.  She has poorly controlled blood pressure and his history of significant cardiac disease.  She would need to be admitted by the medical service in order to address her multiple medical issues and then we could consider revascularization.   She tells me that she quit smoking 3 days ago.  We have discussed the importance of tobacco cessation.  I think this is clearly a limb threatening situation but again I think this is been going on for a month and so would not recommend urgent revascularization but would only consider revascularization after the medical tuneup.  She will be at increased risk for surgery given that she has evidence of protein calorie malnutrition.   [name], MD Office: [phone]   HPI:   Cindy Hess is a pleasant 65 y.o. female, with a complicated history.  Of note she is a poor historian.  He tells me that she was unaware that she was  pregnant when she was 59 and in 2017 was having significant back pain.  She was told that she lost the baby and tells me that after that she had stents placed in both legs and also in her heart in [place].  She states that after the stents were placed her leg symptoms and leg weakness did not improve.  She states that the stents were placed because the legs were weak.  I do not get any clear-cut history of claudication or rest pain.  On 05/23/2020 she went out in the cold without shoes on and states that she developed numbness in the right foot and has been I am unable to move the right foot since January 29.  She been crawling around the house and ultimately came to the emergency department today.  The emergency department spoke to Dr. Darrick Penna who recommended transfer of the patient to [facility] ER for further evaluation.  This patient was seen in October 2019 by Dr. Chestine Spore.  He reviewed her records from [facility] hospital in [facility] where she had previous work done.  In 2017 she had a right common femoral endarterectomy and stenting of the right common and external iliac arteries.  In addition she had a left common and external iliac artery stent.  She apparently was then lost to follow-up.  Records also note that she is had previous coronary angioplasty and stenting.  Past Medical History:  Diagnosis Date  . Blind left eye   . Coronary artery disease   .  GERD (gastroesophageal reflux disease)   . Hyperlipidemia   . Hypertension   . PAD (peripheral artery disease) (HCC)   . Stroke [facility](HCC) 1997    Family History  Problem Relation Age of Onset  . Hypertension Mother     SOCIAL HISTORY: Social History   Socioeconomic History  . Marital status: Single    Spouse name: Not on file  . Number of children: Not on file  . Years of education: Not on file  . Highest education level: Not on file  Occupational History  . Not on file  Tobacco Use  . Smoking status: Current Every Day Smoker     Packs/day: 0.25    Years: 3.00    Pack years: 0.75    Types: Cigarettes  . Smokeless tobacco: Never Used  Vaping Use  . Vaping Use: Never used  Substance and Sexual Activity  . Alcohol use: Yes    Comment: socially   . Drug use: Not Currently  . Sexual activity: Not Currently  Other Topics Concern  . Not on file  Social History Narrative  . Not on file   Social Determinants of Health   Financial Resource Strain: Not on file  Food Insecurity: Not on file  Transportation Needs: Not on file  Physical Activity: Not on file  Stress: Not on file  Social Connections: Not on file  Intimate Partner Violence: Not on file    Allergies  Allergen Reactions  . Lisinopril Cough  . Lovastatin Cough    Current Facility-Administered Medications  Medication Dose Route Frequency Provider Last Rate Last Admin  . heparin ADULT infusion 100 units/mL (25000 units/24950mL)  800 Units/hr Intravenous Continuous Jacalyn LefevreHaviland, Julie, MD 8 mL/hr at 06/10/2020 1656 800 Units/hr at 05/29/2020 1656   Current Outpatient Medications  Medication Sig Dispense Refill  . NEURONTIN 100 MG capsule Take 100 mg by mouth 3 (three) times daily.    Marland Kitchen. amLODipine (NORVASC) 10 MG tablet Take 1 tablet by mouth once daily (Patient not taking: Reported on 06/01/2020) 90 tablet 0  . atorvastatin (LIPITOR) 40 MG tablet Take 1 tablet (40 mg total) by mouth daily. (Patient not taking: Reported on 05/27/2020) 90 tablet 1  . clopidogrel (PLAVIX) 75 MG tablet Take 1 tablet (75 mg total) by mouth daily. (Patient not taking: Reported on 06/20/2020) 90 tablet 3  . losartan (COZAAR) 100 MG tablet Take 1 tablet (100 mg total) by mouth daily. (Patient not taking: Reported on 06/10/2020) 90 tablet 2  . metoprolol succinate (TOPROL-XL) 50 MG 24 hr tablet Take 1 tablet (50 mg total) by mouth daily. Take with or immediately following a meal. (Patient not taking: Reported on 06/22/2020) 90 tablet 1  . ondansetron (ZOFRAN) 4 MG tablet Take 1 tablet (4 mg  total) by mouth every 8 (eight) hours as needed for nausea or vomiting. (Patient not taking: Reported on 05/28/2020) 30 tablet 0  . pantoprazole (PROTONIX) 20 MG tablet Take 1 tablet (20 mg total) by mouth daily. (Patient not taking: Reported on 06/15/2020) 90 tablet 2    REVIEW OF SYSTEMS:  [X]  denotes positive finding, [ ]  denotes negative finding Cardiac  Comments:  Chest pain or chest pressure:    Shortness of breath upon exertion:    Short of breath when lying flat: x   Irregular heart rhythm:        Vascular    Pain in calf, thigh, or hip brought on by ambulation:    Pain in feet at night that wakes you  up from your sleep:  x   Blood clot in your veins:    Leg swelling:         Pulmonary    Oxygen at home:    Productive cough:     Wheezing:         Neurologic    Sudden weakness in arms or legs:  x   Sudden numbness in arms or legs:  x   Sudden onset of difficulty speaking or slurred speech:    Temporary loss of vision in one eye:     Problems with dizziness:         Gastrointestinal    Blood in stool:     Vomited blood:         Genitourinary    Burning when urinating:     Blood in urine:        Psychiatric    Major depression:         Hematologic    Bleeding problems:    Problems with blood clotting too easily:        Skin    Rashes or ulcers:        Constitutional    Fever or chills:     PHYSICAL EXAM:   Vitals:   06/20/2020 1500 05/26/2020 1506 06/15/2020 1630 05/31/2020 1645  BP: (!) 159/98 (!) 159/98 (!) 166/106 (!) 195/108  Pulse: 97 95  (!) 103  Resp: (!) 25 12 (!) 23 15  Temp:  98.8 F (37.1 C)    TempSrc:  Oral    SpO2: 97% 97%  98%  Weight:      Height:       Body mass index is 18.89 kg/m.  GENERAL: The patient is a well-nourished female, in no acute distress. The vital signs are documented above. CARDIAC: There is a regular rate and rhythm.  VASCULAR: I do not detect carotid bruits. On the right side I cannot palpate a femoral pulse or  pedal pulses.  I cannot obtain Doppler signals in the right foot.  She is able to move the foot but states the right foot has been numb for a month. On the left side I cannot palpate a femoral pulse or pedal pulses.  She does have a reasonable but monophasic dorsalis pedis signal on the left with the Doppler. PULMONARY: There is good air exchange bilaterally without wheezing or rales. ABDOMEN: Soft and non-tender with normal pitched bowel sounds.  MUSCULOSKELETAL: There are no major deformities or cyanosis. NEUROLOGIC: No focal weakness or paresthesias are detected. SKIN: There are no ulcers or rashes noted. PSYCHIATRIC: The patient has a normal affect.  DATA:    LABS: Her GFR is greater than 60.  Creatinine is 0.58.  Hemoglobin 14.9.  Platelets 334,000.  CT ANGIO ABDOMEN WITH RUNOFF: I have reviewed the images of her CT angiogram.  The right common and external iliac artery stents are occluded.  The left external iliac artery stent is occluded.  There is diffuse disease throughout the infrarenal aorta and a significant distal aortic stenosis.  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  There is a moderate right renal artery stenosis.  There is a mild stenosis of the proximal left renal artery.  On the right side there is disease in the distal common femoral artery and the deep femoral arteries patent but small.  The superficial femoral artery is small and occludes at Hunter's canal with reconstitution of the popliteal artery which is also small.  On the left side there is disease in the common femoral artery and diffuse disease throughout the superficial femoral artery.

## 2020-06-16 NOTE — ED Notes (Signed)
900 ml urine emptied from canister from external catheter

## 2020-06-16 NOTE — ED Notes (Signed)
Pt given a [place] sandwich, applesauce, and po fluids per Dr. Leafy Half.

## 2020-06-16 NOTE — ED Provider Notes (Signed)
[facility]-EMERGENCY DEPT Provider Note   CSN: [id number] Arrival date & time: 06/01/2020  1042     History Chief Complaint  Patient presents with  . Weakness    Cindy Hess is a 65 y.o. female.  Pt presents to the ED today with generalized weakness and leg pain.  Pt has a hx of PVD and stents in her legs.  Pt said her legs have been causing her more pain and weakness.  So much so that she has not been eating or drinking much since January since it hurts when she tries to walk.  She does live by herself.  Pt denies any cp or sob.  BP elevated per EMS.  Pt has a hx of htn, but is not taking any of her bp meds.  She said she was told her bp was ok and she did not have to take them.        Past Medical History:  Diagnosis Date  . Blind left eye   . Coronary artery disease   . GERD (gastroesophageal reflux disease)   . Hyperlipidemia   . Hypertension   . PAD (peripheral artery disease) (HCC)   . Stroke [facility]) 1997    Patient Active Problem List   Diagnosis Date Noted  . PVD (peripheral vascular disease) (HCC) 02/13/2018  . Bilateral carotid artery stenosis 01/26/2017  . Aftercare following surgery of the circulatory system 01/11/2016  . Erythrocytosis 11/16/2015  . History of CVA (cerebrovascular accident) 11/16/2015  . Hypercalcemia 11/16/2015  . Hypertensive crisis 11/16/2015  . Hypercholesterolemia 04/29/2011    Past Surgical History:  Procedure Laterality Date  . CORONARY ANGIOPLASTY WITH STENT PLACEMENT     3 stent     OB History   No obstetric history on file.     Family History  Problem Relation Age of Onset  . Hypertension Mother     Social History   Tobacco Use  . Smoking status: Current Every Day Smoker    Packs/day: 0.25    Years: 3.00    Pack years: 0.75    Types: Cigarettes  . Smokeless tobacco: Never Used  Vaping Use  . Vaping Use: Never used  Substance Use Topics  . Alcohol use: Yes    Comment: socially   .  Drug use: Not Currently    Home Medications Prior to Admission medications   Medication Sig Start Date End Date Taking? Authorizing Provider  NEURONTIN 100 MG capsule Take 100 mg by mouth 3 (three) times daily. 02/10/20  Yes [provider]  amLODipine (NORVASC) 10 MG tablet Take 1 tablet by mouth once daily Patient not taking: Reported on 06/06/2020 07/25/18   Claiborne Rigg, NP  atorvastatin (LIPITOR) 40 MG tablet Take 1 tablet (40 mg total) by mouth daily. Patient not taking: Reported on 06/20/2020 12/20/17 03/20/18  Claiborne Rigg, NP  clopidogrel (PLAVIX) 75 MG tablet Take 1 tablet (75 mg total) by mouth daily. Patient not taking: Reported on 05/30/2020 12/20/17   Claiborne Rigg, NP  losartan (COZAAR) 100 MG tablet Take 1 tablet (100 mg total) by mouth daily. Patient not taking: Reported on 05/31/2020 12/20/17 03/20/18  Claiborne Rigg, NP  metoprolol succinate (TOPROL-XL) 50 MG 24 hr tablet Take 1 tablet (50 mg total) by mouth daily. Take with or immediately following a meal. Patient not taking: Reported on 05/30/2020 02/14/18 05/15/18  Claiborne Rigg, NP  ondansetron (ZOFRAN) 4 MG tablet Take 1 tablet (4 mg total) by mouth  every 8 (eight) hours as needed for nausea or vomiting. Patient not taking: Reported on 06/04/2020 08/16/18   Mesner, Barbara Cower, MD  pantoprazole (PROTONIX) 20 MG tablet Take 1 tablet (20 mg total) by mouth daily. Patient not taking: Reported on 06/03/2020 12/20/17 03/20/18  Claiborne Rigg, NP    Allergies    Lisinopril and Lovastatin  Review of Systems   Review of Systems  Musculoskeletal:       Bilateral leg pain  Neurological: Positive for weakness.  All other systems reviewed and are negative.   Physical Exam Updated Vital Signs BP (!) 159/98   Pulse 95   Temp 98.8 F (37.1 C) (Oral)   Resp 12   Ht 5\' 1"  (1.549 m)   Wt 45.4 kg   SpO2 97%   BMI 18.89 kg/m   Physical Exam Vitals and nursing note reviewed.  Constitutional:      Appearance:  Normal appearance.  HENT:     Head: Normocephalic and atraumatic.     Comments: Left eye blind    Right Ear: External ear normal.     Left Ear: External ear normal.     Nose: Nose normal.     Mouth/Throat:     Mouth: Mucous membranes are dry.  Eyes:     Extraocular Movements: Extraocular movements intact.     Conjunctiva/sclera: Conjunctivae normal.     Pupils: Pupils are equal, round, and reactive to light.  Cardiovascular:     Rate and Rhythm: Regular rhythm. Tachycardia present.     Heart sounds: Normal heart sounds.  Pulmonary:     Effort: Pulmonary effort is normal.     Breath sounds: Normal breath sounds.  Abdominal:     General: Abdomen is flat. Bowel sounds are normal.     Palpations: Abdomen is soft.  Musculoskeletal:        General: Normal range of motion.  Skin:    General: Skin is warm.     Capillary Refill: Capillary refill takes less than 2 seconds.     Comments: Chronic skin changes BLE Onchomycosis  Neurological:     General: No focal deficit present.     Mental Status: She is alert and oriented to person, place, and time.  Psychiatric:        Mood and Affect: Mood normal.        Behavior: Behavior normal.     ED Results / Procedures / Treatments   Labs (all labs ordered are listed, but only abnormal results are displayed) Labs Reviewed  CBC WITH DIFFERENTIAL/PLATELET - Abnormal; Notable for the following components:      Result Value   WBC 16.0 (*)    RBC 5.19 (*)    Neutro Abs 13.8 (*)    Monocytes Absolute 1.2 (*)    Abs Immature Granulocytes 0.08 (*)    All other components within normal limits  COMPREHENSIVE METABOLIC PANEL - Abnormal; Notable for the following components:   Glucose, Bld 126 (*)    Total Protein 8.3 (*)    All other components within normal limits  URINALYSIS, ROUTINE W REFLEX MICROSCOPIC - Abnormal; Notable for the following components:   Hgb urine dipstick SMALL (*)    Protein, ur 30 (*)    Bacteria, UA RARE (*)    All  other components within normal limits  RAPID URINE DRUG SCREEN, HOSP PERFORMED - Abnormal; Notable for the following components:   Tetrahydrocannabinol POSITIVE (*)    All other components within normal limits  CBG MONITORING, ED - Abnormal; Notable for the following components:   Glucose-Capillary 126 (*)    All other components within normal limits  RESP PANEL BY RT-PCR (FLU A&B, COVID) ARPGX2  LACTIC ACID, PLASMA  APTT  PROTIME-INR    EKG EKG Interpretation  Date/Time:  Tuesday June 16 2020 11:14:08 EST Ventricular Rate:  120 PR Interval:    QRS Duration: 77 QT Interval:  310 QTC Calculation: 438 R Axis:   76 Text Interpretation: Sinus tachycardia LAE, consider biatrial enlargement Left ventricular hypertrophy Anterior infarct, old No old tracing to compare Confirmed by Jacalyn Lefevre [phone]) on 05/29/2020 11:34:13 AM   Radiology DG Chest 2 View  Result Date: 06/10/2020 CLINICAL DATA:  65 year old female with generalized weakness and back pain EXAM: CHEST - 2 VIEW COMPARISON:  None. FINDINGS: The heart size and mediastinal contours are within normal limits. Nipple shadows overlie the bilateral lungs. No focal consolidation. No pleural effusion. No pneumothorax. The visualized skeletal structures are unremarkable. IMPRESSION: No active cardiopulmonary disease. Electronically Signed   By: Maudry Mayhew MD   On: 05/28/2020 11:47   CT Head Wo Contrast  Result Date: 05/26/2020 CLINICAL DATA:  65 year old female with weakness, back pain, mental status change. Hypertensive and tachycardic at presentation. EXAM: CT HEAD WITHOUT CONTRAST TECHNIQUE: Contiguous axial images were obtained from the base of the skull through the vertex without intravenous contrast. COMPARISON:  None. FINDINGS: Brain: Confluent dystrophic calcification in the bilateral basal ganglia, deep cerebellar nuclei. No midline shift, ventriculomegaly, mass effect, evidence of mass lesion, intracranial hemorrhage or  evidence of cortically based acute infarction. Mild for age patchy white matter hypodensity in the superior hemispheres. No cortical encephalomalacia identified. Vascular: Calcified atherosclerosis at the skull base. No suspicious intracranial vascular hyperdensity. Skull: Negative. Sinuses/Orbits: Visualized paranasal sinuses and mastoids are clear. Other: No acute orbit or scalp soft tissue finding. IMPRESSION: 1. No acute intracranial abnormality. 2. 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 3. Mild for age nonspecific white matter changes. Electronically Signed   By: Odessa Fleming M.D.   On: 06/21/2020 11:55   CT Angio Aortobifemoral W and/or Wo Contrast  Result Date: 06/05/2020 CLINICAL DATA:  65 year old female with generalized weakness and back pain. EXAM: CT ANGIOGRAPHY OF ABDOMINAL AORTA WITH ILIOFEMORAL RUNOFF TECHNIQUE: Multidetector CT imaging of the abdomen, pelvis and lower extremities was performed using the standard protocol during bolus administration of intravenous contrast. Multiplanar CT image reconstructions and MIPs were obtained to evaluate the vascular anatomy. CONTRAST:  OMNIPAQUE IOHEXOL 350 MG/ML SOLN COMPARISON:  08/16/2018 FINDINGS: VASCULAR Aorta: Normal caliber. Severe fibrofatty and calcific atherosclerotic changes, most prominent in the distal aorta where there is near circumferential mural plaque resulting in approximately 75% stenosis in the distal aorta. Celiac: Occluded. There is distal reconstitution via the GDA to supply the hepatic, left gastric, and splenic arteries. SMA: Occluded proximally.  Distal reconstitution. Renals: Moderate ostial stenosis of the single right renal artery secondary to atherosclerotic plaque. Mild stenosis of the proximal single left renal artery secondary to atherosclerotic plaque. IMA: Severe ostial stenosis secondary to atherosclerotic plaque. Patent distally including the arc  of Riolan. RIGHT Lower Extremity Inflow: Total occlusion of the common iliac artery and indwelling common to external iliac artery stent. Distal reconstitution in the distal external iliac artery. Outflow: Common femoral artery is patent proximally with severe distal stenosis secondary to atherosclerotic plaque. The profunda is patent but diminutive. The superficial femoral artery is diminutive proximally, gradually  tapering to occlusion at the level of Hunter's canal read there is prominent atherosclerotic calcification. There is distal reconstitution in the distal P1 segment of the popliteal which is diminutive but patent throughout Runoff: Patent trifurcation proximally, each runoff vessel diminutive distally, limited evaluation. LEFT Lower Extremity Inflow: Severe multifocal stenoses of the left common iliac artery secondary to near circumferential and irregular atherosclerotic calcifications. The proximal internal iliac artery is occluded with distal reconstitution. The indwelling left common external iliac artery stent is patent. There is a focal occlusion in the native distal external iliac artery just beyond the distal aspect of the indwelling external iliac artery stent. There is distal reconstitution into the common femoral artery which is diminutive with advanced atherosclerotic calcifications. Outflow: The profunda is patent. The SFA his diminutive throughout with multifocal at least moderate stenosis secondary to atherosclerotic plaque. The popliteal artery is patent but diminutive. Runoff: Patent trifurcation. There is definite inline flow via the anterior tibial artery. The peroneal appears patent to the level of the ankle. The posterior tibial artery tapers proximally without definite inline flow to the foot. Veins: No obvious venous abnormality within the limitations of this arterial phase study. Review of the MIP images confirms the above findings. NON-VASCULAR Lower chest: No acute abnormality.  Hepatobiliary: No focal liver abnormality is seen. No gallstones, gallbladder wall thickening, or biliary dilatation. Pancreas: Unremarkable. No pancreatic ductal dilatation or surrounding inflammatory changes. Spleen: Normal in size without focal abnormality. Adrenals/Urinary Tract: Adrenal glands are unremarkable. Kidneys are normal, without renal calculi, focal lesion, or hydronephrosis. Bladder is unremarkable. Stomach/Bowel: Stomach is distended with enteric contents. Appendix is not definitively visualized. Moderate to large volume stool burden. The sigmoid colon is redundant. No evidence of bowel wall thickening, distention, or inflammatory changes. Lymphatic: No abdominopelvic lymphadenopathy. Reproductive: Uterus and bilateral adnexa are unremarkable. Other: Tiny fat containing umbilical hernia.  No ascites. Musculoskeletal: No acute or significant osseous findings. IMPRESSION: VASCULAR 1. Total occlusion of the common iliac artery and indwelling common to external iliac artery stent. Distal reconstitution in the distal external iliac artery. Short segment chronic occlusion of the distal right superficial artery with reconstitution in the proximal popliteal artery. Patent right trifurcation proximally, limited evaluation of the runoff vessels distally. 2. 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 There is at least moderate stenosis of the native proximal left common iliac artery. The indwelling common external iliac artery stent is patent. Focal occlusion about the native distal left external iliac artery, just distal to the indwelling stent. Diminutive left SFA with at least moderate multifocal stenoses. Patent left trifurcation with inline flow to the foot via the anterior tibial artery. 3. Severe ostial stenosis of the inferior mesenteric artery secondary to atherosclerotic plaque. Unchanged chronic ostial occlusions of the  celiac and superior mesenteric arteries. Patent arc of Riolan. NON-VASCULAR 1. Distended stomach with enteric contents. 2. Moderate to large stool burden. Marliss Coots, MD [facility] Electronically Signed   By: Marliss Coots MD   On: 05/31/2020 14:36    Procedures Procedures   Medications Ordered in ED Medications  heparin bolus via infusion 1,500 Units (has no administration in time range)  heparin ADULT infusion 100 units/mL (25000 units/232mL) (has no administration in time range)  sodium chloride 0.9 % bolus 1,000 mL (1,000 mLs Intravenous Bolus 06/10/2020 1117)  labetalol (NORMODYNE) injection 20 mg (20 mg Intravenous Given 06/21/2020 1151)  hydrALAZINE (APRESOLINE) injection 20 mg (20  mg Intravenous Given 10-19-20 1237)  iohexol (OMNIPAQUE) 350 MG/ML injection 100 mL (100 mLs Intravenous Contrast Given 10-19-20 1244)    ED Course  I have reviewed the triage vital signs and the nursing notes.  Pertinent labs & imaging results that were available during my care of the patient were reviewed by me and considered in my medical decision making (see chart for details).    MDM Rules/Calculators/A&P                          Pt does have significant hypertension.  She was given labetalol and hydralazine for her blood pressure.  BP is much better.  Due to hx of PVD with leg pain and decreased pulses, I ordered a CT of her extremities.  Pt has significant vascular disease, but she has bilateral occluded iliac arteries.  She was d[facility]w vascular [facility] and call vascular when she gets there.  Pt started on heparin.  Covid negative.  CRITICAL CARE Performed by: Jacalyn LefevreJulie Floye Fesler   Total critical care time: 30 minutes  Critical care time was exclusive of separately billable procedures and treating other patients.  Critical care was necessary to treat or prevent imminent or life-threatening deterioration.  Critical  care was time spent personally by me on the following activities: development of treatment plan with patient and/or surrogate as well as nursing, discussions with consultants, evaluation of patient's response to treatment, examination of patient, obtaining history from patient or surrogate, ordering and performing treatments and interventions, ordering and review of laboratory studies, ordering and review of radiographic studies, pulse oximetry and re-evaluation of patient's condition.   Final Clinical Impression(s) / ED Diagnoses Final diagnoses:  Dehydration  Hypertensive urgency  Iliac artery occlusion, bilateral [facility](HCC)    Rx / DC Orders ED Discharge Orders    None       Jacalyn LefevreHaviland, Daxtyn Rottenberg, MD [phone]
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
## 2020-06-17 ENCOUNTER — Other Ambulatory Visit: Payer: Self-pay

## 2020-06-17 ENCOUNTER — Encounter (HOSPITAL_COMMUNITY): Payer: Self-pay | Admitting: Internal Medicine

## 2020-06-17 ENCOUNTER — Inpatient Hospital Stay (HOSPITAL_COMMUNITY): Payer: Medicare Other

## 2020-06-17 DIAGNOSIS — R579 Shock, unspecified: Secondary | ICD-10-CM | POA: Diagnosis not present

## 2020-06-17 DIAGNOSIS — I998 Other disorder of circulatory system: Secondary | ICD-10-CM | POA: Diagnosis present

## 2020-06-17 DIAGNOSIS — N179 Acute kidney failure, unspecified: Secondary | ICD-10-CM | POA: Diagnosis not present

## 2020-06-17 DIAGNOSIS — K7200 Acute and subacute hepatic failure without coma: Secondary | ICD-10-CM | POA: Diagnosis not present

## 2020-06-17 DIAGNOSIS — Z20822 Contact with and (suspected) exposure to covid-19: Secondary | ICD-10-CM | POA: Diagnosis present

## 2020-06-17 DIAGNOSIS — Y712 Prosthetic and other implants, materials and accessory cardiovascular devices associated with adverse incidents: Secondary | ICD-10-CM | POA: Diagnosis present

## 2020-06-17 DIAGNOSIS — I7409 Other arterial embolism and thrombosis of abdominal aorta: Secondary | ICD-10-CM | POA: Diagnosis present

## 2020-06-17 DIAGNOSIS — I2510 Atherosclerotic heart disease of native coronary artery without angina pectoris: Secondary | ICD-10-CM | POA: Diagnosis not present

## 2020-06-17 DIAGNOSIS — T82856A Stenosis of peripheral vascular stent, initial encounter: Secondary | ICD-10-CM | POA: Diagnosis present

## 2020-06-17 DIAGNOSIS — H5462 Unqualified visual loss, left eye, normal vision right eye: Secondary | ICD-10-CM | POA: Diagnosis present

## 2020-06-17 DIAGNOSIS — E440 Moderate protein-calorie malnutrition: Secondary | ICD-10-CM | POA: Diagnosis present

## 2020-06-17 DIAGNOSIS — J189 Pneumonia, unspecified organism: Secondary | ICD-10-CM | POA: Diagnosis not present

## 2020-06-17 DIAGNOSIS — Z681 Body mass index (BMI) 19 or less, adult: Secondary | ICD-10-CM | POA: Diagnosis not present

## 2020-06-17 DIAGNOSIS — E162 Hypoglycemia, unspecified: Secondary | ICD-10-CM | POA: Diagnosis not present

## 2020-06-17 DIAGNOSIS — Z8249 Family history of ischemic heart disease and other diseases of the circulatory system: Secondary | ICD-10-CM | POA: Diagnosis not present

## 2020-06-17 DIAGNOSIS — R6521 Severe sepsis with septic shock: Secondary | ICD-10-CM | POA: Diagnosis not present

## 2020-06-17 DIAGNOSIS — I745 Embolism and thrombosis of iliac artery: Secondary | ICD-10-CM | POA: Diagnosis present

## 2020-06-17 DIAGNOSIS — A419 Sepsis, unspecified organism: Secondary | ICD-10-CM | POA: Diagnosis not present

## 2020-06-17 DIAGNOSIS — Z515 Encounter for palliative care: Secondary | ICD-10-CM | POA: Diagnosis not present

## 2020-06-17 DIAGNOSIS — M79606 Pain in leg, unspecified: Secondary | ICD-10-CM | POA: Diagnosis not present

## 2020-06-17 DIAGNOSIS — E7800 Pure hypercholesterolemia, unspecified: Secondary | ICD-10-CM | POA: Diagnosis present

## 2020-06-17 DIAGNOSIS — R1110 Vomiting, unspecified: Secondary | ICD-10-CM

## 2020-06-17 DIAGNOSIS — K551 Chronic vascular disorders of intestine: Secondary | ICD-10-CM | POA: Diagnosis present

## 2020-06-17 DIAGNOSIS — K55029 Acute infarction of small intestine, extent unspecified: Secondary | ICD-10-CM | POA: Diagnosis not present

## 2020-06-17 DIAGNOSIS — R112 Nausea with vomiting, unspecified: Secondary | ICD-10-CM

## 2020-06-17 DIAGNOSIS — D689 Coagulation defect, unspecified: Secondary | ICD-10-CM | POA: Diagnosis not present

## 2020-06-17 DIAGNOSIS — I160 Hypertensive urgency: Secondary | ICD-10-CM | POA: Diagnosis present

## 2020-06-17 DIAGNOSIS — I779 Disorder of arteries and arterioles, unspecified: Secondary | ICD-10-CM | POA: Diagnosis not present

## 2020-06-17 DIAGNOSIS — K559 Vascular disorder of intestine, unspecified: Secondary | ICD-10-CM | POA: Diagnosis not present

## 2020-06-17 DIAGNOSIS — E782 Mixed hyperlipidemia: Secondary | ICD-10-CM | POA: Diagnosis not present

## 2020-06-17 DIAGNOSIS — K55059 Acute (reversible) ischemia of intestine, part and extent unspecified: Secondary | ICD-10-CM | POA: Diagnosis not present

## 2020-06-17 DIAGNOSIS — R079 Chest pain, unspecified: Secondary | ICD-10-CM | POA: Diagnosis not present

## 2020-06-17 DIAGNOSIS — E860 Dehydration: Secondary | ICD-10-CM | POA: Diagnosis present

## 2020-06-17 DIAGNOSIS — R9431 Abnormal electrocardiogram [ECG] [EKG]: Secondary | ICD-10-CM | POA: Diagnosis not present

## 2020-06-17 DIAGNOSIS — I70223 Atherosclerosis of native arteries of extremities with rest pain, bilateral legs: Secondary | ICD-10-CM | POA: Diagnosis present

## 2020-06-17 DIAGNOSIS — Z66 Do not resuscitate: Secondary | ICD-10-CM | POA: Diagnosis not present

## 2020-06-17 DIAGNOSIS — E876 Hypokalemia: Secondary | ICD-10-CM | POA: Diagnosis present

## 2020-06-17 LAB — CBC
HCT: 45.2 % (ref 36.0–46.0)
HCT: 43.5 % (ref 36.0–46.0)
Hemoglobin: 14.8 g/dL (ref 12.0–15.0)
Hemoglobin: 14.6 g/dL (ref 12.0–15.0)
MCH: 28.3 pg (ref 26.0–34.0)
MCH: 28.6 pg (ref 26.0–34.0)
MCHC: 32.7 g/dL (ref 30.0–36.0)
MCHC: 33.6 g/dL (ref 30.0–36.0)
MCV: 86.4 fL (ref 80.0–100.0)
MCV: 85.1 fL (ref 80.0–100.0)
Platelets: 342 10*3/uL (ref 150–400)
Platelets: 340 10*3/uL (ref 150–400)
RBC: 5.23 MIL/uL — ABNORMAL HIGH (ref 3.87–5.11)
RBC: 5.11 MIL/uL (ref 3.87–5.11)
RDW: 13.5 % (ref 11.5–15.5)
RDW: 13.4 % (ref 11.5–15.5)
WBC: 23 10*3/uL — ABNORMAL HIGH (ref 4.0–10.5)
WBC: 25.5 10*3/uL — ABNORMAL HIGH (ref 4.0–10.5)
nRBC: 0 % (ref 0.0–0.2)
nRBC: 0 % (ref 0.0–0.2)

## 2020-06-17 LAB — HEPARIN LEVEL (UNFRACTIONATED)
Heparin Unfractionated: 0.54 IU/mL (ref 0.30–0.70)
Heparin Unfractionated: 0.48 IU/mL (ref 0.30–0.70)

## 2020-06-17 LAB — COMPREHENSIVE METABOLIC PANEL
ALT: 17 U/L (ref 0–44)
ALT: 13 U/L (ref 0–44)
AST: 22 U/L (ref 15–41)
AST: 19 U/L (ref 15–41)
Albumin: 3 g/dL — ABNORMAL LOW (ref 3.5–5.0)
Albumin: 2.7 g/dL — ABNORMAL LOW (ref 3.5–5.0)
Alkaline Phosphatase: 94 U/L (ref 38–126)
Alkaline Phosphatase: 93 U/L (ref 38–126)
Anion gap: 16 — ABNORMAL HIGH (ref 5–15)
Anion gap: 13 (ref 5–15)
BUN: 13 mg/dL (ref 8–23)
BUN: 11 mg/dL (ref 8–23)
CO2: 22 mmol/L (ref 22–32)
CO2: 24 mmol/L (ref 22–32)
Calcium: 9.5 mg/dL (ref 8.9–10.3)
Calcium: 9.2 mg/dL (ref 8.9–10.3)
Chloride: 102 mmol/L (ref 98–111)
Chloride: 101 mmol/L (ref 98–111)
Creatinine, Ser: 0.54 mg/dL (ref 0.44–1.00)
Creatinine, Ser: 0.42 mg/dL — ABNORMAL LOW (ref 0.44–1.00)
GFR, Estimated: >60 mL/min (ref >60)
GFR, Estimated: >60 mL/min (ref >60)
Glucose, Bld: 140 mg/dL — ABNORMAL HIGH (ref 70–99)
Glucose, Bld: 123 mg/dL — ABNORMAL HIGH (ref 70–99)
Potassium: 3.3 mmol/L — ABNORMAL LOW (ref 3.5–5.1)
Potassium: 3.7 mmol/L (ref 3.5–5.1)
Sodium: 140 mmol/L (ref 135–145)
Sodium: 138 mmol/L (ref 135–145)
Total Bilirubin: 0.9 mg/dL (ref 0.3–1.2)
Total Bilirubin: 0.8 mg/dL (ref 0.3–1.2)
Total Protein: 7.5 g/dL (ref 6.5–8.1)
Total Protein: 7.3 g/dL (ref 6.5–8.1)

## 2020-06-17 LAB — CBC WITH DIFFERENTIAL/PLATELET
Abs Immature Granulocytes: 0.19 10*3/uL — ABNORMAL HIGH (ref 0.00–0.07)
Basophils Absolute: 0 10*3/uL (ref 0.0–0.1)
Basophils Relative: 0 %
Eosinophils Absolute: 0 10*3/uL (ref 0.0–0.5)
Eosinophils Relative: 0 %
HCT: 44.4 % (ref 36.0–46.0)
Hemoglobin: 14.8 g/dL (ref 12.0–15.0)
Immature Granulocytes: 1 %
Lymphocytes Relative: 5 %
Lymphs Abs: 1.1 10*3/uL (ref 0.7–4.0)
MCH: 29.1 pg (ref 26.0–34.0)
MCHC: 33.3 g/dL (ref 30.0–36.0)
MCV: 87.2 fL (ref 80.0–100.0)
Monocytes Absolute: 2.2 10*3/uL — ABNORMAL HIGH (ref 0.1–1.0)
Monocytes Relative: 10 %
Neutro Abs: 19.7 10*3/uL — ABNORMAL HIGH (ref 1.7–7.7)
Neutrophils Relative %: 84 %
Platelets: 344 10*3/uL (ref 150–400)
RBC: 5.09 MIL/uL (ref 3.87–5.11)
RDW: 13.6 % (ref 11.5–15.5)
WBC: 23.3 10*3/uL — ABNORMAL HIGH (ref 4.0–10.5)
nRBC: 0 % (ref 0.0–0.2)

## 2020-06-17 LAB — LIPID PANEL
Cholesterol: 152 mg/dL (ref 0–200)
HDL: 40 mg/dL — ABNORMAL LOW (ref >40)
LDL Cholesterol: 100 mg/dL — ABNORMAL HIGH (ref 0–99)
Total CHOL/HDL Ratio: 3.8 RATIO
Triglycerides: 59 mg/dL (ref <150)
VLDL: 12 mg/dL (ref 0–40)

## 2020-06-17 LAB — LACTIC ACID, PLASMA: Lactic Acid, Venous: 1.4 mmol/L (ref 0.5–1.9)

## 2020-06-17 LAB — HEMOGLOBIN A1C
Hgb A1c MFr Bld: 6 % — ABNORMAL HIGH (ref 4.8–5.6)
Mean Plasma Glucose: 125.5 mg/dL

## 2020-06-17 LAB — ABO/RH: ABO/RH(D): O POS

## 2020-06-17 LAB — MAGNESIUM: Magnesium: 2 mg/dL (ref 1.7–2.4)

## 2020-06-17 LAB — TROPONIN I (HIGH SENSITIVITY): Troponin I (High Sensitivity): 19 ng/L — ABNORMAL HIGH (ref <18)

## 2020-06-17 LAB — HIV ANTIBODY (ROUTINE TESTING W REFLEX): HIV Screen 4th Generation wRfx: NONREACTIVE

## 2020-06-17 MED ORDER — AMLODIPINE BESYLATE 5 MG PO TABS: 5.0000 mg | ORAL_TABLET | Freq: Every day | ORAL | Status: DC

## 2020-06-17 MED ORDER — CARVEDILOL 12.5 MG PO TABS
12.5000 mg | ORAL_TABLET | Freq: Two times a day (BID) | ORAL | Status: DC
  Administered 2020-06-17 – 2020-06-18 (×2): 12.5 mg via ORAL
  Filled 2020-06-17 (×2): qty 1

## 2020-06-17 MED ORDER — ASPIRIN EC 81 MG PO TBEC
81.0000 mg | DELAYED_RELEASE_TABLET | Freq: Once | ORAL | Status: AC
  Administered 2020-06-17: 81 mg via ORAL
  Filled 2020-06-17: qty 1

## 2020-06-17 MED ORDER — CARVEDILOL 3.125 MG PO TABS: 6.2500 mg | ORAL_TABLET | Freq: Two times a day (BID) | ORAL | Status: DC

## 2020-06-17 MED ORDER — ENSURE ENLIVE PO LIQD
237.0000 mL | Freq: Two times a day (BID) | ORAL | Status: DC
  Administered 2020-06-18 (×2): 237 mL via ORAL

## 2020-06-17 MED ORDER — AMLODIPINE BESYLATE 10 MG PO TABS
10.0000 mg | ORAL_TABLET | Freq: Every day | ORAL | Status: DC
  Administered 2020-06-18: 10 mg via ORAL
  Filled 2020-06-17: qty 1

## 2020-06-17 MED ORDER — HYDRALAZINE HCL 20 MG/ML IJ SOLN
10.0000 mg | INTRAMUSCULAR | Status: DC | PRN
  Administered 2020-06-17 – 2020-06-18 (×3): 10 mg via INTRAVENOUS
  Filled 2020-06-17 (×4): qty 1

## 2020-06-17 MED ORDER — ASPIRIN EC 81 MG PO TBEC
81.0000 mg | DELAYED_RELEASE_TABLET | Freq: Every day | ORAL | Status: DC
  Administered 2020-06-17 – 2020-06-18 (×2): 81 mg via ORAL
  Filled 2020-06-17 (×2): qty 1

## 2020-06-17 MED ORDER — METOPROLOL TARTRATE 5 MG/5ML IV SOLN
5.0000 mg | Freq: Once | INTRAVENOUS | Status: AC
  Administered 2020-06-17: 5 mg via INTRAVENOUS
  Filled 2020-06-17: qty 5

## 2020-06-17 MED ORDER — POTASSIUM CHLORIDE 10 MEQ/100ML IV SOLN
10.0000 meq | INTRAVENOUS | Status: AC
  Administered 2020-06-17 (×5): 10 meq via INTRAVENOUS
  Filled 2020-06-17 (×5): qty 100

## 2020-06-17 NOTE — ED Notes (Signed)
SDU Breakfast Order Placed 

## 2020-06-17 NOTE — ED Notes (Signed)
Pt vomited clear liquid with areas of dark red fluid present. Notified Dr. Leafy Half via secure chat.

## 2020-06-17 NOTE — ED Notes (Addendum)
Pt noted to have elevated HR and BP with nausea and anxiety after breakfast. Coached to do deep breathing and gave nausea meds.

## 2020-06-17 NOTE — ED Notes (Signed)
S/W Dr. Toniann Fail and new orders received for labs.

## 2020-06-17 NOTE — Progress Notes (Signed)
ANTICOAGULATION CONSULT NOTE - Follow Up Consult  Pharmacy Consult for heparin Indication: arterial occlusion  Labs: Recent Labs    07/05/20 1112 06/17/20 0008  HGB 14.9  --   HCT 44.6  --   PLT 334  --   APTT 38*  --   LABPROT 15.5*  --   INR 1.3*  --   HEPARINUNFRC  --  0.54  CREATININE 0.58  --     Assessment/Plan:  65yo female therapeutic on heparin with initial dosing for arterial occlusion. Will continue gtt at current rate of 800 units/hr and confirm stable with additional level.   Vernard Gambles, PharmD, BCPS  06/17/2020,1:16 AM

## 2020-06-17 NOTE — Progress Notes (Addendum)
   VASCULAR SURGERY ASSESSMENT & PLAN:   AORTOILIAC OCCLUSIVE DISEASE: Her right foot feels a little better on heparin.  She is able to move the foot and the foot appears viable.  I have carefully reviewed her arteriogram.  I think her best option for revascularization would be a right axillobifemoral bypass graft.  Her celiac axis and SMA are occluded.  Her only perfusion to the intestine is via the IMA and left hypogastric artery.  Aortofemoral bypass grafting would have to be done end-to-end given the laminated thrombus within the aorta.  For this reason she would be at very high risk for catastrophic bowel ischemia if she lost perfusion to the IMA and left hypogastric.  Both external iliac arteries are occluded.   HYPERTENSIVE EMERGENCY: Her blood pressure in the emergency department yesterday was 232/141. Medicine is aggressively addressing her blood pressure. This morning, her blood pressure is 183/102.  She has a history of coronary stents in the remote past but has had no recent cardiac symptoms. I will defer the need for cardiac work-up to the medical service.  If she is ready medically I might be able to do this Friday afternoon.  Otherwise it would be Tuesday next week.    SUBJECTIVE:   She states that her right foot feels a little better.  PHYSICAL EXAM:   Vitals:   06/17/20 0515 06/17/20 0530 06/17/20 0536 06/17/20 0545  BP: (!) 193/114 (!) 200/112  (!) 188/117  Pulse: (!) 103 (!) 122  (!) 107  Resp: (!) 22 (!) 31  (!) 21  Temp:   98.2 F (36.8 C)   TempSrc:   Oral   SpO2: 98% 99%  97%  Weight:      Height:       She is able to move the right foot some in the foot has some temperature and appears viable.  LABS:   Lab Results  Component Value Date   WBC 23.0 (H) 06/17/2020   HGB 14.8 06/17/2020   HCT 45.2 06/17/2020   MCV 86.4 06/17/2020   PLT 342 06/17/2020   Lab Results  Component Value Date   CREATININE 0.54 06/17/2020   Lab Results  Component Value Date    INR 1.3 (H) 2020-06-26   CBG (last 3)  Recent Labs    06-26-2020 1114  GLUCAP 126*    PROBLEM LIST:    Principal Problem:   Hypertensive urgency Active Problems:   Nicotine dependence, cigarettes, uncomplicated   Coronary artery disease involving native coronary artery of native heart without angina pectoris   Mixed hyperlipidemia   Occlusion of right iliac artery (HCC)   GERD without esophagitis   CURRENT MEDS:   . amLODipine  5 mg Oral Daily  . atorvastatin  40 mg Oral Daily  . carvedilol  6.25 mg Oral BID WC  . losartan  50 mg Oral Daily  . pantoprazole  40 mg Oral Daily    [name] Office: [phone] 06/17/2020

## 2020-06-17 NOTE — Consult Note (Signed)
Cardiology Consult    Patient ID: Cindy Hess MRN: [id number], DOB/AGE: 1955-07-17   Admit date: 06/17/2020 Date of Consult: 06/17/2020  Primary Physician: Patient, No Pcp Per Primary Cardiologist: none Requesting Provider: Trey Paula, MD  Patient Profile    Cindy Hess is a 65 y.o. female with a history of hypertension, stroke, medical non-compliance, ongoing tobacco use, and severe PAD s/p endarterectomy of R EIA/CFA with vein patch angioplasty and placement of bilateral kissing iliac stents. She also has chronic mesenteric ischemia as well. She is admitted for critical limb ischemia and hypertensive crisis. She is being seen today for the evaluation of ECG changes associated with chest pain.   History of Present Illness    As noted previously, the patient is a poor and unreliable historian. She was admitted yesterday for numbness and weakness in her legs (particularly her right foot) that has left her completely non-ambulatory since January 29th. She was found to have an occluded right common iliac and external iliac artery stent and occluded left external iliac artery stent. Her initial blood pressure on arrival to the ED was 232/141. Prior to admission, she was not taking any of her medications, stating that she was told she didn't need to. She has also been having nausea. She has been started on multiple antihypertensives and blood pressure still remains quite elevated. There has been some hesitation to be more aggressive with blood pressure control due to the extent and severity of her PAD.   Tonight, she called her nurse due to chest pain who contacted the covering hospitalist. An ECG was obtained, which I reviewed, that shows T wave inversions not present on her prior ECG from this admission. She was given morphine with resolution of the pain. In speaking with the patient, she says she had a couple minutes of chest pain just after eating. She was given morphine very shortly  after and her chest pain resolved without any recurrence. She denies any associated symptoms other than nausea, which has been an ongoing issue. She has had 2 troponins since then that came back at 19 and 22. She denies any recent history of chest pain.   Of note, I tried to clarify documentation in the chart of a prior coronary intervention. She stats that she became pregnant at the age of 63 in 2017 and went to [place] in [place]. She says an ectopic pregnancy was discovered and she underwent an "abortion". She says this was during the admission that she underwent extensive iliofemoral revascularization ("stents in her legs"). She thinks that she had coronary stents placed at the same time and has been reporting this to other providers. I reviewed all of the documentation and reports from that admission (all available in Epic). She clearly had no coronary procedures at that time, nor is there any evidence elsewhere in the chart here or Care Everywhere the she has ever had any coronary intervention. Even more unusual, there is no mention anywhere in her medical records of a pregnancy, including the 2017 admission at [facility]. She also does not have a cardiologist.   Past Medical History   Past Medical History:  Diagnosis Date  . Blind left eye   . Coronary artery disease   . GERD (gastroesophageal reflux disease)   . Hyperlipidemia   . Hypertension   . PAD (peripheral artery disease) (HCC)   . Stroke [facility]) 1997    Past Surgical History:  Procedure Laterality Date  . CORONARY ANGIOPLASTY WITH STENT PLACEMENT  3 stent     Allergies  Allergen Reactions  . Lisinopril Cough  . Lovastatin Cough   Inpatient Medications    . [START ON 06/02/2020] amLODipine  10 mg Oral Daily  . aspirin EC  81 mg Oral Daily  . atorvastatin  40 mg Oral Daily  . carvedilol  12.5 mg Oral BID WC  . [START ON 05/30/2020] feeding supplement  237 mL Oral BID BM  . losartan  50 mg Oral Daily  .  pantoprazole  40 mg Oral Daily    Family History    Family History  Problem Relation Age of Onset  . Hypertension Mother    She indicated that her mother is deceased.   Social History    Social History   Socioeconomic History  . Marital status: Single    Spouse name: Not on file  . Number of children: Not on file  . Years of education: Not on file  . Highest education level: Not on file  Occupational History  . Not on file  Tobacco Use  . Smoking status: Current Every Day Smoker    Packs/day: 0.25    Years: 3.00    Pack years: 0.75    Types: Cigarettes  . Smokeless tobacco: Never Used  Vaping Use  . Vaping Use: Never used  Substance and Sexual Activity  . Alcohol use: Yes    Comment: socially   . Drug use: Not Currently  . Sexual activity: Not Currently  Other Topics Concern  . Not on file  Social History Narrative  . Not on file   Social Determinants of Health   Financial Resource Strain: Not on file  Food Insecurity: Not on file  Transportation Needs: Not on file  Physical Activity: Not on file  Stress: Not on file  Social Connections: Not on file  Intimate Partner Violence: Not on file     Review of Systems    General:  No chills, fever, night sweats or weight changes.  Cardiovascular:  No chest pain, dyspnea on exertion, edema, orthopnea, palpitations, paroxysmal nocturnal dyspnea. Dermatological: No rash, lesions/masses Respiratory: No cough, dyspnea Urologic: No hematuria, dysuria Abdominal:   No nausea, vomiting, diarrhea, bright red blood per rectum, melena, or hematemesis Neurologic:  No visual changes, wkns, changes in mental status. All other systems reviewed and are otherwise negative except as noted above.  Physical Exam    Blood pressure (!) 170/80, pulse 66, temperature 98.1 F (36.7 C), temperature source Oral, resp. rate 12, height  (1.549 m), weight 39.3 kg, SpO2 97 %.     Intake/Output Summary (Last 24 hours) at 06/17/2020  2315 Last data filed at 06/17/2020 1319 Gross per 24 hour  Intake 100 ml  Output -  Net 100 ml   Wt Readings from Last 3 Encounters:  06/17/20 39.3 kg  10/07/19 46.7 kg  08/15/18 43.1 kg    CONSTITUTIONAL: alert and conversant, in no acute distress HEENT: conjunctiva normal, EOM intact, pupils equal, no lid lag. NECK: symmetric, no JVD CARDIOVASCULAR: Regular rhythm. Systolic flow murmur, no gallop or rub. No carotid bruits. Radial pulses 2+. Femoral and DP pulses are non-palpable bilterally.  PULMONARY/CHEST WALL: no deformities, normal breath sounds bilaterally, normal work of breathing ABDOMINAL: soft, non-tender, non-distended EXTREMITIES: no edema. NEUROLOGIC: alert, normal gait, no abnormal movements, cranial nerves grossly intact.   Labs   Cardiac Panel (last 3 results) Recent Labs    06/17/20 2158 06/01/2020 0036  TROPONINIHS 19* 22*    Lab  Results  Component Value Date   WBC 25.5 (H) 06/17/2020   HGB 14.6 06/17/2020   HCT 43.5 06/17/2020   MCV 85.1 06/17/2020   PLT 340 06/17/2020    Recent Labs  Lab 06/17/20 1347  NA 138  K 3.7  CL 101  CO2 24  BUN 11  CREATININE 0.42*  CALCIUM 9.2  PROT 7.3  BILITOT 0.8  ALKPHOS 93  ALT 13  AST 19  GLUCOSE 123*   Lab Results  Component Value Date   CHOL 152 06/17/2020   HDL 40 (L) 06/17/2020   LDLCALC 100 (H) 06/17/2020   TRIG 59 06/17/2020     Radiology Studies    DG Chest 1 View  Result Date: 06/17/2020 CLINICAL DATA:  Chest pain, shortness of breath, elevated blood pressure EXAM: CHEST  1 VIEW COMPARISON:  Radiograph 06/03/2020 FINDINGS: Mild hyperinflation similar to comparison exam. No consolidation, features of edema, pneumothorax, or effusion. Pulmonary vascularity is normally distributed. The cardiomediastinal contours are unremarkable. No acute osseous or soft tissue abnormality. The cardiomediastinal contours are unremarkable. No acute osseous or soft tissue abnormality. Degenerative changes are  present in the imaged spine and shoulders. Telemetry leads overlie the chest. IMPRESSION: No acute cardiopulmonary disease. Chronic hyperinflation. Electronically Signed   By: Kreg Shropshire M.D.   On: 06/17/2020 22:17   DG Chest 2 View  Result Date: 06/13/2020 CLINICAL DATA:  65 year old female with generalized weakness and back pain EXAM: CHEST - 2 VIEW COMPARISON:  None. FINDINGS: The heart size and mediastinal contours are within normal limits. Nipple shadows overlie the bilateral lungs. No focal consolidation. No pleural effusion. No pneumothorax. The visualized skeletal structures are unremarkable. IMPRESSION: No active cardiopulmonary disease. Electronically Signed   By: Maudry Mayhew MD   On: 06/21/2020 11:47   CT Head Wo Contrast  Result Date: 06/13/2020 CLINICAL DATA:  65 year old female with weakness, back pain, mental status change. Hypertensive and tachycardic at presentation. EXAM: CT HEAD WITHOUT CONTRAST TECHNIQUE: Contiguous axial images were obtained from the base of the skull through the vertex without intravenous contrast. COMPARISON:  None. FINDINGS: Brain: Confluent dystrophic calcification in the bilateral basal ganglia, deep cerebellar nuclei. No midline shift, ventriculomegaly, mass effect, evidence of mass lesion, intracranial hemorrhage or evidence of cortically based acute infarction. Mild for age patchy white matter hypodensity in the superior hemispheres. No cortical encephalomalacia identified. Vascular: Calcified atherosclerosis at the skull base. No suspicious intracranial vascular hyperdensity. Skull: Negative. Sinuses/Orbits: Visualized paranasal sinuses and mastoids are clear. Other: No acute orbit or scalp soft tissue finding. IMPRESSION: 1. No acute intracranial abnormality. 2. 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 3. Mild for age nonspecific white matter changes. Electronically Signed   By: Odessa Fleming  M.D.   On: 06/05/2020 11:55   CT Angio Aortobifemoral W and/or Wo Contrast  Result Date: 06/08/2020 CLINICAL DATA:  65 year old female with generalized weakness and back pain. EXAM: CT ANGIOGRAPHY OF ABDOMINAL AORTA WITH ILIOFEMORAL RUNOFF TECHNIQUE: Multidetector CT imaging of the abdomen, pelvis and lower extremities was performed using the standard protocol during bolus administration of intravenous contrast. Multiplanar CT image reconstructions and MIPs were obtained to evaluate the vascular anatomy. CONTRAST:  OMNIPAQUE IOHEXOL 350 MG/ML SOLN COMPARISON:  08/16/2018 FINDINGS: VASCULAR Aorta: Normal caliber. Severe fibrofatty and calcific atherosclerotic changes, most prominent in the distal aorta where there is near circumferential mural plaque resulting in approximately 75% stenosis in the distal aorta. Celiac: Occluded. There is distal  reconstitution via the GDA to supply the hepatic, left gastric, and splenic arteries. SMA: Occluded proximally.  Distal reconstitution. Renals: Moderate ostial stenosis of the single right renal artery secondary to atherosclerotic plaque. Mild stenosis of the proximal single left renal artery secondary to atherosclerotic plaque. IMA: Severe ostial stenosis secondary to atherosclerotic plaque. Patent distally including the arc of Riolan. RIGHT Lower Extremity Inflow: Total occlusion of the common iliac artery and indwelling common to external iliac artery stent. Distal reconstitution in the distal external iliac artery. Outflow: Common femoral artery is patent proximally with severe distal stenosis secondary to atherosclerotic plaque. The profunda is patent but diminutive. The superficial femoral artery is diminutive proximally, gradually tapering to occlusion at the level of Hunter's canal read there is prominent atherosclerotic calcification. There is distal reconstitution in the distal P1 segment of the popliteal which is diminutive but patent throughout Runoff:  Patent trifurcation proximally, each runoff vessel diminutive distally, limited evaluation. LEFT Lower Extremity Inflow: Severe multifocal stenoses of the left common iliac artery secondary to near circumferential and irregular atherosclerotic calcifications. The proximal internal iliac artery is occluded with distal reconstitution. The indwelling left common external iliac artery stent is patent. There is a focal occlusion in the native distal external iliac artery just beyond the distal aspect of the indwelling external iliac artery stent. There is distal reconstitution into the common femoral artery which is diminutive with advanced atherosclerotic calcifications. Outflow: The profunda is patent. The SFA his diminutive throughout with multifocal at least moderate stenosis secondary to atherosclerotic plaque. The popliteal artery is patent but diminutive. Runoff: Patent trifurcation. There is definite inline flow via the anterior tibial artery. The peroneal appears patent to the level of the ankle. The posterior tibial artery tapers proximally without definite inline flow to the foot. Veins: No obvious venous abnormality within the limitations of this arterial phase study. Review of the MIP images confirms the above findings. NON-VASCULAR Lower chest: No acute abnormality. Hepatobiliary: No focal liver abnormality is seen. No gallstones, gallbladder wall thickening, or biliary dilatation. Pancreas: Unremarkable. No pancreatic ductal dilatation or surrounding inflammatory changes. Spleen: Normal in size without focal abnormality. Adrenals/Urinary Tract: Adrenal glands are unremarkable. Kidneys are normal, without renal calculi, focal lesion, or hydronephrosis. Bladder is unremarkable. Stomach/Bowel: Stomach is distended with enteric contents. Appendix is not definitively visualized. Moderate to large volume stool burden. The sigmoid colon is redundant. No evidence of bowel wall thickening, distention, or  inflammatory changes. Lymphatic: No abdominopelvic lymphadenopathy. Reproductive: Uterus and bilateral adnexa are unremarkable. Other: Tiny fat containing umbilical hernia.  No ascites. Musculoskeletal: No acute or significant osseous findings. IMPRESSION: VASCULAR 1. Total occlusion of the common iliac artery and indwelling common to external iliac artery stent. Distal reconstitution in the distal external iliac artery. Short segment chronic occlusion of the distal right superficial artery with reconstitution in the proximal popliteal artery. Patent right trifurcation proximally, limited evaluation of the runoff vessels distally. 2. 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 There is at least moderate stenosis of the native proximal left common iliac artery. The indwelling common external iliac artery stent is patent. Focal occlusion about the native distal left external iliac artery, just distal to the indwelling stent. Diminutive left SFA with at least moderate multifocal stenoses. Patent left trifurcation with inline flow to the foot via the anterior tibial artery. 3. Severe ostial stenosis of the inferior mesenteric artery secondary to atherosclerotic plaque. 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 Patent arc of Riolan. NON-VASCULAR 1. Distended stomach with enteric contents. 2. Moderate to large stool burden. Marliss Coots, MD [facility] Electronically Signed   By: Marliss Coots MD   On: 06/17/2020 14:36    ECG & Cardiac Imaging    ECG: NSR, LVH, diffuse TWI most prominent in anterolateral leads, probably repolarization abnormality due to LVH - personally reviewed.  TTE report, 2017: The left ventricle is normal in size.  There is moderate concentric left ventricular hypertrophy with normal wall  motion and ejection  fraction 65-70%..  Mild aortic sclerosis is present with good valvular opening.   Left Ventricle  The left ventricle is normal in size. There is moderate concentric left ventricular hypertrophy with normal wall motion and ejection fraction 65-70%.   Right Ventricle  The right ventricle is grossly normal in size and function.   Atria  The left atrium is normal size. The right atrium is normal.   Mitral Valve  The mitral valve is mildly thickened with trace regurgitation.   Tricuspid Valve  The tricuspid valve is normal in structure and function.   Aortic Valve  Mild aortic sclerosis ispresent with good valvular opening. There is no aortic regurgitation present.   Pulmonic Valve  The pulmonic valve is not well visualized, unable to adequately assess function.   Vessels  The aortic root is normal in diameter.   Pericardium  There is no pericardial effusion.    Assessment & Plan    65 y.o. female with a history of uncontrolled hypertension, prior stroke, medical non-compliance, ongoing tobacco use, and severe PAD s/p endarterectomy of R EIA/CFA with vein patch angioplasty and placement of bilateral kissing iliac stents. She also has chronic mesenteric ischemia as well. She is admitted for critical limb ischemia and hypertensive crisis. Overnight, she had a very brief episode of chest discomfort after eating that resolved with morphine. No other chest pain history. The chest pain is highly atypical. Her ECG at the time showed T-wave inversion not present on ECGs from the ED (the only prior tracings available). My limited echo showed significant LVH with normal LVEF and wall motion. ECGs in the ED are routinely performed with limb lead electrodes placed on the chest and abdomen, which can significantly alter T-wave morphology in the setting of repolarization abnormalities. Her current T-wave abnormality appears consistent with repolarization changes that are commonly seen with significant  LVH, the appearance of which are likely due to differences in lead placement. Additionally, her troponins are minimal and flat, probably within normal limits for her blood pressure and LVH. I agree with continued antihypertensive titration. She does not require any ACS treatment. I will also note that I do not believe that she actually has any prior PCI history, as outlined above. Recommend repeat ECG to document stability of her ECG changes, which are likely chronic.   Signed, Clerance Lav, MD 06/17/2020, 11:15 PM  For questions or updates, please contact   Please consult [email or web address] for contact info under Cardiology/STEMI.

## 2020-06-17 NOTE — Progress Notes (Signed)
PROGRESS NOTE    Cindy Hess   LKG:[id number]  DOB: 25-Mar-1956  DOA: 06/10/2020 PCP: Patient, No Pcp Per   Brief Narrative:  Cindy Hess is a 65 year old female with hypertension, severe peripheral artery disease status post multiple interventions, coronary artery disease status post stenting, hyperlipidemia, GERD and nicotine dependence who presents to the ED for complaints of severe pain and weakness in both of her legs.  She states that the legs hurt when she tries to walk.  She is also had a poor appetite and has not been eating and drinking well.  She has not been taking her blood pressure medications at home because she was told that her blood pressure was okay and she did not have to take them anymore.  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  Blood pressure was noted to be significantly elevated with readings as high as 232/141 and 183/102.  WBC count was 16. Urine drug screen was positive for tetrahydrocannabinol.  She was admitted for further work-up and treatment of her peripheral artery disease along with a hypertensive urgency.  She was started on a heparin infusion.  Subjective: He is having pain in her right leg today.  In addition she is vomiting, having lower abdominal pain and some loose stools.  She states that she has been vomiting for at least 1 month now after she eats and has lost a considerable amount of weight.    Assessment & Plan:   Principal Problem:   Hypertensive urgency -She is receiving amlodipine, carvedilol and losartan-BP has improved slightly -I have doubled her amlodipine from 5 mg to 10 mg -I have increased her carvedilol from 6.25-12.5 -Goal systolic BP today is 160-170 - avoid correcting further than this to prevent exacerbation ischemia -Continue to control pain which will also allow BP to improve  Active  Problems:  Severe vascular disease Occlusion of celiac trunk, 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, occlusion of common iliac artery and indwelling stent from common to external iliac,   -Has been started on a heparin infusion by vascular surgery -Symptoms include possible mesenteric ischemia (vomiting and abdominal pain after meals) and right lower extremity ischemia  -Vascular surgery has offered revascularization with a right axillobifemoral bypass graft- plan discussed with Dr Edilia Bo  Vomiting and abdominal pain - CT reveals that the stomach is distended with food and she has a large amount of stool in bowel- will order enemas - ? Of mesenteric ischemia noted above- recheck lactic acid  Leukocytosis - likely due to ischemia- follow    Nicotine dependence, cigarettes, uncomplicated -She states her last cigarette was a few days prior to coming to the hospital    Coronary artery disease involving native coronary artery of native heart without angina pectoris -Home medication include Plavix and Lipitor    Mixed hyperlipidemia -Resume Lipitor    GERD without esophagitis -Resume Protonix which she takes at home  Hypokalemia -Replacing via IV and not orally due to vomiting -follow  Time spent in minutes: 35 minutes DVT prophylaxis: Heparin infusion Code Status: Full code Family Communication:  Level of Care: Level of care: Progressive Disposition Plan:  Status is: Observation  The patient will require care spanning > 2 midnights and should be moved to inpatient because: IV treatments appropriate due to intensity of illness or inability to take PO  Dispo: The patient  is from: Home              Anticipated d/c is to: To be determined              Anticipated d/c date is: > 3 days              Patient currently is not medically stable to d/c.   Difficult to place patient No  Consultants:   Vascular surgery Procedures:    Antimicrobials:  Anti-infectives  (From admission, onward)   None       Objective: Vitals:   06/17/20 0515 06/17/20 0530 06/17/20 0536 06/17/20 0545  BP: (!) 193/114 (!) 200/112  (!) 188/117  Pulse: (!) 103 (!) 122  (!) 107  Resp: (!) 22 (!) 31  (!) 21  Temp:   98.2 F (36.8 C)   TempSrc:   Oral   SpO2: 98% 99%  97%  Weight:      Height:       No intake or output data in the 24 hours ending 06/17/20 0758 Filed Weights   06/21/2020 1108  Weight: 45.4 kg    Examination: General exam: Appears comfortable  HEENT: PERRLA, oral mucosa moist, no sclera icterus or thrush Respiratory system: Clear to auscultation. Respiratory effort normal. Cardiovascular system: S1 & S2 heard, RRR.   Gastrointestinal system: Abdomen soft,  tender in left lower quadrent, nondistended. Normal bowel sounds. Central nervous system: Alert and oriented. No focal neurological deficits. Extremities: No cyanosis, clubbing or edema Skin: No rashes or ulcers Psychiatry:  Mood & affect appropriate.     Data Reviewed: I have personally reviewed following labs and imaging studies  CBC: Recent Labs  Lab 05/28/2020 1112 06/17/20 0310 06/17/20 0315  WBC 16.0* 23.3* 23.0*  NEUTROABS 13.8* 19.7*  --   HGB 14.9 14.8 14.8  HCT 44.6 44.4 45.2  MCV 85.9 87.2 86.4  PLT 334 344 342   Basic Metabolic Panel: Recent Labs  Lab 05/30/2020 1112 06/17/20 0310  NA 139 140  K 3.6 3.3*  CL 98 102  CO2 26 22  GLUCOSE 126* 140*  BUN 13 13  CREATININE 0.58 0.54  CALCIUM 9.5 9.5  MG  --  2.0   GFR: Estimated Creatinine Clearance: 50.2 mL/min (by C-G formula based on SCr of 0.54 mg/dL). Liver Function Tests: Recent Labs  Lab 06/11/2020 1112 06/17/20 0310  AST 19 22  ALT 16 17  ALKPHOS 98 94  BILITOT 1.0 0.9  PROT 8.3* 7.5  ALBUMIN 3.5 3.0*   No results for input(s): LIPASE, AMYLASE in the last 168 hours. No results for input(s): AMMONIA in the last 168 hours. Coagulation Profile: Recent Labs  Lab 06/14/2020 1112  INR 1.3*   Cardiac  Enzymes: No results for input(s): CKTOTAL, CKMB, CKMBINDEX, TROPONINI in the last 168 hours. BNP (last 3 results) No results for input(s): PROBNP in the last 8760 hours. HbA1C: Recent Labs    06/17/20 0310  HGBA1C 6.0*   CBG: Recent Labs  Lab 06/04/2020 1114  GLUCAP 126*   Lipid Profile: Recent Labs    06/17/20 0310  CHOL 152  HDL 40*  LDLCALC 100*  TRIG 59  CHOLHDL 3.8   Thyroid Function Tests: No results for input(s): TSH, T4TOTAL, FREET4, T3FREE, THYROIDAB in the last 72 hours. Anemia Panel: No results for input(s): VITAMINB12, FOLATE, FERRITIN, TIBC, IRON, RETICCTPCT in the last 72 hours. Urine analysis:    Component Value Date/Time   COLORURINE YELLOW 06/20/2020 1129   APPEARANCEUR  CLEAR 05/30/2020 1129   LABSPEC 1.008 05/28/2020 1129   PHURINE 6.0 06/15/2020 1129   GLUCOSEU NEGATIVE 06/11/2020 1129   HGBUR SMALL (A) 05/29/2020 1129   BILIRUBINUR NEGATIVE 05/29/2020 1129   KETONESUR NEGATIVE 06/09/2020 1129   PROTEINUR 30 (A) 06/03/2020 1129   NITRITE NEGATIVE 06/01/2020 1129   LEUKOCYTESUR NEGATIVE 06/21/2020 1129   Sepsis Labs: @LABRCNTIP (procalcitonin:4,lacticidven:4) ) Recent Results (from the past 240 hour(s))  Resp Panel by RT-PCR (Flu A&B, Covid) Nasopharyngeal Swab     Status: None   Collection Time: 06/07/2020 11:16 AM   Specimen: Nasopharyngeal Swab; Nasopharyngeal(NP) swabs in vial transport medium  Result Value Ref Range Status   SARS Coronavirus 2 by RT PCR NEGATIVE NEGATIVE Final    Comment: (NOTE) SARS-CoV-2 target nucleic acids are NOT DETECTED.  The SARS-CoV-2 RNA is generally detectable in upper respiratory specimens during the acute phase of infection. The lowest concentration of SARS-CoV-2 viral copies this assay can detect is 138 copies/mL. A negative result does not preclude SARS-Cov-2 infection and should not be used as the sole basis for treatment or other patient management decisions. A negative result may occur with  improper  specimen collection/handling, submission of specimen other than nasopharyngeal swab, presence of viral mutation(s) within the areas targeted by this assay, and inadequate number of viral copies(<138 copies/mL). A negative result must be combined with clinical observations, patient history, and epidemiological information. The expected result is Negative.  Fact Sheet for Patients:  06/17/2020  Fact Sheet for Healthcare Providers:  BloggerCourse.com  This test is no t yet approved or cleared by the [employer].[place] FDA and  has been authorized for detection and/or diagnosis of SARS-CoV-2 by FDA under an Emergency Use Authorization (EUA). This EUA will remain  in effect (meaning this test can be used) for the duration of the COVID-19 declaration under Section 564(b)(1) of the Act, 21 U.S.C.section 360bbb-3(b)(1), unless the authorization is terminated  or revoked sooner.       Influenza A by PCR NEGATIVE NEGATIVE Final   Influenza B by PCR NEGATIVE NEGATIVE Final    Comment: (NOTE) The Xpert Xpress SARS-CoV-2/FLU/RSV plus assay is intended as an aid in the diagnosis of influenza from Nasopharyngeal swab specimens and should not be used as a sole basis for treatment. Nasal washings and aspirates are unacceptable for Xpert Xpress SARS-CoV-2/FLU/RSV testing.  Fact Sheet for Patients: [place]  Fact Sheet for Healthcare Providers: BloggerCourse.com  This test is not yet approved or cleared by the [employer]ious[employer] FDA and has been authorized for detection and/or diagnosis of SARS-CoV-2 by FDA under an Emergency Use Authorization (EUA). This EUA will remain in effect (meaning this test can be used) for the duration of the COVID-19 declaration under Section 564(b)(1) of the Act, 21 U.S.C. section 360bbb-3(b)(1), unless the authorization is terminated or revoked.  Performed at  [facility], [address], [place], [place]          Radiology Studies: DG Chest 2 View  Result Date: 06/06/2020 CLINICAL DATA:  65 year old female with generalized weakness and back pain EXAM: CHEST - 2 VIEW COMPARISON:  None. FINDINGS: The heart size and mediastinal contours are within normal limits. Nipple shadows overlie the bilateral lungs. No focal consolidation. No pleural effusion. No pneumothorax. The visualized skeletal structures are unremarkable. IMPRESSION: No active cardiopulmonary disease. Electronically Signed   By: 76 MD   On: 05/29/2020 11:47   CT Head Wo Contrast  Result Date: 06/11/2020 CLINICAL DATA:  66 year old female  with weakness, back pain, mental status change. Hypertensive and tachycardic at presentation. EXAM: CT HEAD WITHOUT CONTRAST TECHNIQUE: Contiguous axial images were obtained from the base of the skull through the vertex without intravenous contrast. COMPARISON:  None. FINDINGS: Brain: Confluent dystrophic calcification in the bilateral basal ganglia, deep cerebellar nuclei. No midline shift, ventriculomegaly, mass effect, evidence of mass lesion, intracranial hemorrhage or evidence of cortically based acute infarction. Mild for age patchy white matter hypodensity in the superior hemispheres. No cortical encephalomalacia identified. Vascular: Calcified atherosclerosis at the skull base. No suspicious intracranial vascular hyperdensity. Skull: Negative. Sinuses/Orbits: Visualized paranasal sinuses and mastoids are clear. Other: No acute orbit or scalp soft tissue finding. IMPRESSION: 1. No acute intracranial abnormality. 2. 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 3. Mild for age nonspecific white matter changes. Electronically Signed   By: Odessa FlemingH  Hall M.D.   On: 2020/08/23 11:55   CT Angio Aortobifemoral W and/or Wo Contrast  Result Date:  12/29/2020 CLINICAL DATA:  [id number] year old female with generalized weakness and back pain. EXAM: CT ANGIOGRAPHY OF ABDOMINAL AORTA WITH ILIOFEMORAL RUNOFF TECHNIQUE: Multidetector CT imaging of the abdomen, pelvis and lower extremities was performed using the standard protocol during bolus administration of intravenous contrast. Multiplanar CT image reconstructions and MIPs were obtained to evaluate the vascular anatomy. CONTRAST:  100mL OMNIPAQUE IOHEXOL 350 MG/ML SOLN COMPARISON:  08/16/2018 FINDINGS: VASCULAR Aorta: Normal caliber. Severe fibrofatty and calcific atherosclerotic changes, most prominent in the distal aorta where there is near circumferential mural plaque resulting in approximately 75% stenosis in the distal aorta. Celiac: Occluded. There is distal reconstitution via the GDA to supply the hepatic, left gastric, and splenic arteries. SMA: Occluded proximally.  Distal reconstitution. Renals: Moderate ostial stenosis of the single right renal artery secondary to atherosclerotic plaque. Mild stenosis of the proximal single left renal artery secondary to atherosclerotic plaque. IMA: Severe ostial stenosis secondary to atherosclerotic plaque. Patent distally including the arc of Riolan. RIGHT Lower Extremity Inflow: Total occlusion of the common iliac artery and indwelling common to external iliac artery stent. Distal reconstitution in the distal external iliac artery. Outflow: Common femoral artery is patent proximally with severe distal stenosis secondary to atherosclerotic plaque. The profunda is patent but diminutive. The superficial femoral artery is diminutive proximally, gradually tapering to occlusion at the level of Hunter's canal read there is prominent atherosclerotic calcification. There is distal reconstitution in the distal P1 segment of the popliteal which is diminutive but patent throughout Runoff: Patent trifurcation proximally, each runoff vessel diminutive distally, limited evaluation. LEFT  Lower Extremity Inflow: Severe multifocal stenoses of the left common iliac artery secondary to near circumferential and irregular atherosclerotic calcifications. The proximal internal iliac artery is occluded with distal reconstitution. The indwelling left common external iliac artery stent is patent. There is a focal occlusion in the native distal external iliac artery just beyond the distal aspect of the indwelling external iliac artery stent. There is distal reconstitution into the common femoral artery which is diminutive with advanced atherosclerotic calcifications. Outflow: The profunda is patent. The SFA his diminutive throughout with multifocal at least moderate stenosis secondary to atherosclerotic plaque. The popliteal artery is patent but diminutive. Runoff: Patent trifurcation. There is definite inline flow via the anterior tibial artery. The peroneal appears patent to the level of the ankle. The posterior tibial artery tapers proximally without definite inline flow to the foot. Veins: No obvious venous abnormality within the limitations of this arterial phase study. Review of  the MIP images confirms the above findings. NON-VASCULAR Lower chest: No acute abnormality. Hepatobiliary: No focal liver abnormality is seen. No gallstones, gallbladder wall thickening, or biliary dilatation. Pancreas: Unremarkable. No pancreatic ductal dilatation or surrounding inflammatory changes. Spleen: Normal in size without focal abnormality. Adrenals/Urinary Tract: Adrenal glands are unremarkable. Kidneys are normal, without renal calculi, focal lesion, or hydronephrosis. Bladder is unremarkable. Stomach/Bowel: Stomach is distended with enteric contents. Appendix is not definitively visualized. Moderate to large volume stool burden. The sigmoid colon is redundant. No evidence of bowel wall thickening, distention, or inflammatory changes. Lymphatic: No abdominopelvic lymphadenopathy. Reproductive: Uterus and bilateral  adnexa are unremarkable. Other: Tiny fat containing umbilical hernia.  No ascites. Musculoskeletal: No acute or significant osseous findings. IMPRESSION: VASCULAR 1. Total occlusion of the common iliac artery and indwelling common to external iliac artery stent. Distal reconstitution in the distal external iliac artery. Short segment chronic occlusion of the distal right superficial artery with reconstitution in the proximal popliteal artery. Patent right trifurcation proximally, limited evaluation of the runoff vessels distally. 2. 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 There is at least moderate stenosis of the native proximal left common iliac artery. The indwelling common external iliac artery stent is patent. Focal occlusion about the native distal left external iliac artery, just distal to the indwelling stent. Diminutive left SFA with at least moderate multifocal stenoses. Patent left trifurcation with inline flow to the foot via the anterior tibial artery. 3. Severe ostial stenosis of the inferior mesenteric artery secondary to atherosclerotic plaque. Unchanged chronic ostial occlusions of the celiac and superior mesenteric arteries. Patent arc of Riolan. NON-VASCULAR 1. Distended stomach with enteric contents. 2. Moderate to large stool burden. Marliss Coots, MD [facility] Radiology Electronically Signed   By: Marliss Coots MD   On: 06/22/2020 14:36      Scheduled Meds: . amLODipine  5 mg Oral Daily  . aspirin EC  81 mg Oral Daily  . atorvastatin  40 mg Oral Daily  . carvedilol  6.25 mg Oral BID WC  . losartan  50 mg Oral Daily  . pantoprazole  40 mg Oral Daily   Continuous Infusions: . heparin 800 Units/hr (05/26/2020 1656)     LOS: 0 days      Calvert Cantor, MD [facility] Pager: [email or web address] 06/17/2020, 7:58 AM

## 2020-06-17 NOTE — ED Notes (Signed)
Attempted to call report, informed by secretary the RN is not on the floor yet.

## 2020-06-17 NOTE — Progress Notes (Addendum)
ANTICOAGULATION CONSULT NOTE  Pharmacy Consult for heparin infusion Indication: arterial occlusion  Allergies  Allergen Reactions  . Lisinopril Cough  . Lovastatin Cough    Patient Measurements: Height: 5\' 1"  (154.9 cm) Weight: 45.4 kg (100 lb) IBW/kg (Calculated) : 47.8 Heparin Dosing Weight: 45.4 kg  Vital Signs: Temp: 98.2 F (36.8 C) (02/23 0536) Temp Source: Oral (02/23 0536) BP: 188/117 (02/23 0545) Pulse Rate: 107 (02/23 0545)  Labs: Recent Labs    07-08-20 1112 06/17/20 0008 06/17/20 0310 06/17/20 0315  HGB 14.9  --  14.8 14.8  HCT 44.6  --  44.4 45.2  PLT 334  --  344 342  APTT 38*  --   --   --   LABPROT 15.5*  --   --   --   INR 1.3*  --   --   --   HEPARINUNFRC  --  0.54 0.48  --   CREATININE 0.58  --  0.54  --     Estimated Creatinine Clearance: 50.2 mL/min (by C-G formula based on SCr of 0.54 mg/dL).   Medical History: Past Medical History:  Diagnosis Date  . Blind left eye   . Coronary artery disease   . GERD (gastroesophageal reflux disease)   . Hyperlipidemia   . Hypertension   . PAD (peripheral artery disease) (HCC)   . Stroke [facility]) 1997    Assessment: 64 yof continuing on heparin for arterial occlusion. Heparin level remains therapeutic this AM. Hgb and PLT WNL. Patient is not on anticoagulation PTA. Vascular Surgery planning right axillobifemoral bypass graft, possibly as early as this Friday. No bleed issues reported.  Goal of Therapy:  Heparin level 0.3-0.7 units/ml Monitor platelets by anticoagulation protocol: Yes   Plan:  Continue heparin at 800 units/hr Monitor daily heparin level and CBC, s/sx bleeding F/u Vascular plans   Friday, PharmD, BCPS Please check AMION for all [facility] contact numbers Clinical Pharmacist 06/17/2020 7:34 AM

## 2020-06-17 NOTE — ED Notes (Signed)
Paged Dr. Toniann Fail in regards to pt vomiting.

## 2020-06-17 NOTE — Progress Notes (Addendum)
Called by RN that pt complains of CP, nausea, SOB with elevated BP.  Has been having nausea and vomiting since before admission.  Admitted with hypertensive urgency and has PAD. Is on heparin infusion.   Has zofran, morphine ordered which is being provided.  Has hydralazine ordered but since hydralazine can cause ischemic demand on cardiac muscle will hold at this time Given metoprolol IV for elevated BP. HR in 80s.   Obtain troponin levels, EKG and CXR.   Monitor closely   Addendum:  EKG shows T wave inversions in inferior and anterolateral leads. Discussed with Cardiology, Dr. Okey Dupre.  Cardiology to be consulted and will see pt tonight to provide recommendations.

## 2020-06-18 ENCOUNTER — Encounter (HOSPITAL_COMMUNITY): Payer: Self-pay | Admitting: Certified Registered Nurse Anesthetist

## 2020-06-18 ENCOUNTER — Other Ambulatory Visit (HOSPITAL_COMMUNITY): Payer: Medicare Other

## 2020-06-18 DIAGNOSIS — I160 Hypertensive urgency: Secondary | ICD-10-CM | POA: Diagnosis not present

## 2020-06-18 DIAGNOSIS — I779 Disorder of arteries and arterioles, unspecified: Secondary | ICD-10-CM | POA: Diagnosis not present

## 2020-06-18 DIAGNOSIS — E782 Mixed hyperlipidemia: Secondary | ICD-10-CM | POA: Diagnosis not present

## 2020-06-18 DIAGNOSIS — K55059 Acute (reversible) ischemia of intestine, part and extent unspecified: Secondary | ICD-10-CM | POA: Diagnosis not present

## 2020-06-18 DIAGNOSIS — I745 Embolism and thrombosis of iliac artery: Secondary | ICD-10-CM | POA: Diagnosis not present

## 2020-06-18 DIAGNOSIS — I2510 Atherosclerotic heart disease of native coronary artery without angina pectoris: Secondary | ICD-10-CM | POA: Diagnosis not present

## 2020-06-18 LAB — BLOOD GAS, ARTERIAL
Acid-base deficit: 15.7 mmol/L — ABNORMAL HIGH (ref 0.0–2.0)
Bicarbonate: 9.9 mmol/L — ABNORMAL LOW (ref 20.0–28.0)
Drawn by: 36496
FIO2: 100
O2 Saturation: 99 %
Patient temperature: 36.9
pCO2 arterial: 22 mmHg — ABNORMAL LOW (ref 32.0–48.0)
pH, Arterial: 7.275 — ABNORMAL LOW (ref 7.350–7.450)
pO2, Arterial: 344 mmHg — ABNORMAL HIGH (ref 83.0–108.0)

## 2020-06-18 LAB — BASIC METABOLIC PANEL
Anion gap: 10 (ref 5–15)
BUN: 9 mg/dL (ref 8–23)
CO2: 22 mmol/L (ref 22–32)
Calcium: 8.6 mg/dL — ABNORMAL LOW (ref 8.9–10.3)
Chloride: 103 mmol/L (ref 98–111)
Creatinine, Ser: 0.37 mg/dL — ABNORMAL LOW (ref 0.44–1.00)
GFR, Estimated: >60 mL/min (ref >60)
Glucose, Bld: 108 mg/dL — ABNORMAL HIGH (ref 70–99)
Potassium: 3.4 mmol/L — ABNORMAL LOW (ref 3.5–5.1)
Sodium: 135 mmol/L (ref 135–145)

## 2020-06-18 LAB — TROPONIN I (HIGH SENSITIVITY): Troponin I (High Sensitivity): 22 ng/L — ABNORMAL HIGH (ref <18)

## 2020-06-18 LAB — CBC
HCT: 37.9 % (ref 36.0–46.0)
Hemoglobin: 12.9 g/dL (ref 12.0–15.0)
MCH: 28.9 pg (ref 26.0–34.0)
MCHC: 34 g/dL (ref 30.0–36.0)
MCV: 84.8 fL (ref 80.0–100.0)
Platelets: 297 10*3/uL (ref 150–400)
RBC: 4.47 MIL/uL (ref 3.87–5.11)
RDW: 13.4 % (ref 11.5–15.5)
WBC: 19.7 10*3/uL — ABNORMAL HIGH (ref 4.0–10.5)
nRBC: 0 % (ref 0.0–0.2)

## 2020-06-18 LAB — GLUCOSE, CAPILLARY
Glucose-Capillary: 234 mg/dL — ABNORMAL HIGH (ref 70–99)
Glucose-Capillary: 51 mg/dL — ABNORMAL LOW (ref 70–99)

## 2020-06-18 LAB — MAGNESIUM: Magnesium: 1.8 mg/dL (ref 1.7–2.4)

## 2020-06-18 LAB — HEPARIN LEVEL (UNFRACTIONATED): Heparin Unfractionated: 0.44 IU/mL (ref 0.30–0.70)

## 2020-06-18 LAB — MRSA PCR SCREENING: MRSA by PCR: NEGATIVE

## 2020-06-18 MED ORDER — LOSARTAN POTASSIUM 50 MG PO TABS
100.0000 mg | ORAL_TABLET | Freq: Every day | ORAL | Status: DC
  Administered 2020-06-18: 100 mg via ORAL
  Filled 2020-06-18: qty 2

## 2020-06-18 MED ORDER — SORBITOL 70 % SOLN
60.0000 mL | Freq: Once | Status: AC
  Administered 2020-06-18: 60 mL via ORAL
  Filled 2020-06-18: qty 60

## 2020-06-18 MED ORDER — NALOXONE HCL 0.4 MG/ML IJ SOLN
0.4000 mg | INTRAMUSCULAR | Status: DC | PRN
  Administered 2020-06-18 (×2): 0.4 mg via INTRAVENOUS
  Filled 2020-06-18: qty 1

## 2020-06-18 MED ORDER — CARVEDILOL 25 MG PO TABS
25.0000 mg | ORAL_TABLET | Freq: Two times a day (BID) | ORAL | Status: DC
  Administered 2020-06-18: 25 mg via ORAL
  Filled 2020-06-18: qty 1

## 2020-06-18 MED ORDER — KETOROLAC TROMETHAMINE 15 MG/ML IJ SOLN
15.0000 mg | Freq: Once | INTRAMUSCULAR | Status: AC
  Administered 2020-06-18: 15 mg via INTRAVENOUS
  Filled 2020-06-18: qty 1

## 2020-06-18 MED ORDER — HYDRALAZINE HCL 10 MG PO TABS
10.0000 mg | ORAL_TABLET | Freq: Three times a day (TID) | ORAL | Status: DC
  Administered 2020-06-18 (×2): 10 mg via ORAL
  Filled 2020-06-18 (×2): qty 1

## 2020-06-18 MED ORDER — ENSURE ENLIVE PO LIQD
237.0000 mL | Freq: Three times a day (TID) | ORAL | Status: DC
  Administered 2020-06-18: 237 mL via ORAL

## 2020-06-18 MED ORDER — HEPARIN (PORCINE) 25000 UT/250ML-% IV SOLN
800.0000 [IU]/h | INTRAVENOUS | Status: DC
  Administered 2020-06-19: 800 [IU]/h via INTRAVENOUS
  Filled 2020-06-18 (×2): qty 250

## 2020-06-18 MED ORDER — SORBITOL 70 % SOLN
960.0000 mL | TOPICAL_OIL | Freq: Once | ORAL | Status: AC
  Administered 2020-06-18: 960 mL via RECTAL
  Filled 2020-06-18: qty 473

## 2020-06-18 MED ORDER — POTASSIUM CHLORIDE 10 MEQ/100ML IV SOLN
10.0000 meq | INTRAVENOUS | Status: AC
  Administered 2020-06-18 (×4): 10 meq via INTRAVENOUS
  Filled 2020-06-18 (×4): qty 100

## 2020-06-18 MED ORDER — NALOXONE HCL 0.4 MG/ML IJ SOLN
INTRAMUSCULAR | Status: AC
  Administered 2020-06-19: 0.4 mg via INTRAVENOUS
  Filled 2020-06-18: qty 2

## 2020-06-18 MED ORDER — DEXTROSE 50 % IV SOLN: 50.0000 mL | Freq: Once | INTRAVENOUS | Status: AC

## 2020-06-18 MED ORDER — DEXTROSE 50 % IV SOLN
INTRAVENOUS | Status: AC
  Administered 2020-06-18: 50 mL via INTRAVENOUS
  Filled 2020-06-18: qty 50

## 2020-06-18 MED ORDER — CEFAZOLIN SODIUM-DEXTROSE 1-4 GM/50ML-% IV SOLN: 1.0000 g | INTRAVENOUS | Status: DC

## 2020-06-18 NOTE — Progress Notes (Signed)
Chaplain responded to Code Blue. Patient was stabilized. No family present. Chaplain is available when needed.   06/06/2020 2300  Clinical Encounter Type  Visited With Health care provider  Visit Type Code  Referral From Nurse  Consult/Referral To Chaplain

## 2020-06-18 NOTE — Progress Notes (Addendum)
PROGRESS NOTE    Cindy Hess   ASN:[id number]  DOB: 1955-08-07  DOA: Jun 27, 2020 PCP: Patient, No Pcp Per   Brief Narrative:  Cindy Hess is a 65 year old female with hypertension, severe peripheral artery disease status post multiple interventions, coronary artery disease status post stenting, hyperlipidemia, GERD and nicotine dependence who presents to the ED for complaints of severe pain and weakness in both of her legs.  She states that the legs hurt when she tries to walk.  She is also had a poor appetite and has not been eating and drinking well.  She has not been taking her blood pressure medications at home because she was told that her blood pressure was okay and she did not have to take them anymore.  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  Blood pressure was noted to be significantly elevated with readings as high as 232/141 and 183/102.  WBC count was 16. Urine drug screen was positive for tetrahydrocannabinol.  She was admitted for further work-up and treatment of her peripheral artery disease along with a hypertensive urgency.  She was started on a heparin infusion.  Subjective: Her stomach feels tight. She has some chest pain last night but it has not recurred. Her leg pain is relatively controlled with narcotics.     Assessment & Plan:   Principal Problem:   Hypertensive urgency - she had stopped all of her BP meds at home -She is now receiving amlodipine, carvedilol and losartan-BP has improved slightly -I have doubled her amlodipine from 5 mg to 10 mg -I have increased her carvedilol from 6.25-12.5 - Increased Losartan from 50 to 100 mg  -add low dose Hydralazine 10 mg TID  -Goal systolic BP is 160-170 - avoid correcting further than this to prevent exacerbation of underlying ischemia- -Continue to control pain which will also  allow BP to improve    Active Problems: Severe vascular disease Occlusion of celiac trunk, 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, occlusion of common iliac artery and indwelling stent from common to external iliac,   -Has been started on a heparin infusion by vascular surgery -Symptoms include possible mesenteric ischemia (vomiting and abdominal pain after meals) and right lower extremity ischemia  - very complicated case due to her extent of vascular disease - Vascular surgery has offered revascularization with a right axillobifemoral bypass graft- plan discussed with Dr Edilia Bo    Chest pain Coronary artery disease involving native coronary artery of native heart   -Home medication include Plavix and Lipitor - appreciate cardiology evaluation last night- Troponin minimally elevated at 19 and 22 - ? If GI related (see below)  Vomiting and abdominal pain- h/o GERD - Protonix being given daily - CT reveals that the stomach is distended with food and she has a large amount of stool in bowel- will order enemas - ? Of mesenteric ischemia noted above- Lactic acid remains normal - I ordered 2 enemas yesterday- neither was given (which the patient confirms) - today will order 2 enemas and Sorbitol- follow stool output  Leukocytosis - likely due to ischemia- follow closely- no signs of infection    Nicotine dependence, cigarettes, uncomplicated -She states her last cigarette was a few days prior to coming to the hospital and is vowing to quit completely   Mixed hyperlipidemia - LDL 100,HDL 40 -Resume Lipitor  GERD without esophagitis -Resume Protonix which she takes at home  Hypokalemia -Replacing via IV and not orally due to nausea/vomiting -follow - Mg is 1.8- will give her 2 gm IV Mg to get > 2.0  Underweight/ moderately malnourished Body mass index is 16.37 kg/m. - cont dietary supplements  Time spent in minutes: 35 minutes DVT prophylaxis: Heparin  infusion Code Status: Full code Family Communication:  Level of Care: Level of care: Progressive Disposition Plan:  Status is: Observation  The patient will require care spanning > 2 midnights and should be moved to inpatient because: IV treatments appropriate due to intensity of illness or inability to take PO  Dispo: The patient is from: Home              Anticipated d/c is to: To be determined              Anticipated d/c date is: > 3 days              Patient currently is not medically stable to d/c.   Difficult to place patient No  Consultants:   Vascular surgery Procedures:    Antimicrobials:  Anti-infectives (From admission, onward)   Start     Dose/Rate Route Frequency Ordered Stop   2020/07/10 0800  ceFAZolin (ANCEF) IVPB 1 g/50 mL premix       Note to Pharmacy: Send with pt to OR   1 g 100 mL/hr over 30 Minutes Intravenous On call 06/16/2020 0726 06/20/20 0800       Objective: Vitals:   06/14/2020 0400 06/05/2020 0506 06/01/2020 0746 06/17/2020 0747  BP: (!) 180/103 (!) 195/99 (!) 187/99 (!) 187/99  Pulse: 78 71 80 77  Resp: Temp: 98.5 F (36.9 C)   98 F (36.7 C)  TempSrc: Oral   Oral  SpO2: 100% 100%  100%  Weight:      Height:        Intake/Output Summary (Last 24 hours) at 06/17/2020 1051 Last data filed at 06/11/2020 0700 Gross per 24 hour  Intake 600.81 ml  Output 600 ml  Net 0.81 ml   Filed Weights   06/06/2020 1108 06/17/20 1220  Weight: 45.4 kg 39.3 kg    Examination: General exam: Appears comfortable  HEENT: PERRLA, oral mucosa moist, no sclera icterus or thrush Respiratory system: Clear to auscultation. Respiratory effort normal. Cardiovascular system: S1 & S2 heard, regular rate and rhythm Gastrointestinal system: Abdomen soft, non-tender, nondistended. Normal bowel sounds   Central nervous system: Alert and oriented. No focal neurological deficits. Extremities: No cyanosis, clubbing or edema Skin: No rashes or ulcers Psychiatry:   Mood & affect appropriate.    Data Reviewed: I have personally reviewed following labs and imaging studies  CBC: Recent Labs  Lab 05/28/2020 1112 06/17/20 0310 06/17/20 0315 06/17/20 1102 06/17/2020 0036  WBC 16.0* 23.3* 23.0* 25.5* 19.7*  NEUTROABS 13.8* 19.7*  --   --   --   HGB 14.9 14.8 14.8 14.6 12.9  HCT 44.6 44.4 45.2 43.5 37.9  MCV 85.9 87.2 86.4 85.1 84.8  PLT 334 344 342 340 297   Basic Metabolic Panel: Recent Labs  Lab 06/22/2020 1112 06/17/20 0310 06/17/20 1347 05/31/2020 0036  NA 139 140 138 135  K 3.6 3.3* 3.7 3.4*  CL 98 102 101 103  CO2 GLUCOSE 126* 140* 123* 108*  BUN CREATININE 0.58 0.54 0.42* 0.37*  CALCIUM 9.5 9.5  9.2 8.6*  MG  --  2.0  --  1.8   GFR: Estimated Creatinine Clearance: 43.5 mL/min (A) (by C-G formula based on SCr of 0.37 mg/dL (L)). Liver Function Tests: Recent Labs  Lab 06/11/2020 1112 06/17/20 0310 06/17/20 1347  AST 19 22 19   ALT 16 17 13   ALKPHOS 98 94 93  BILITOT 1.0 0.9 0.8  PROT 8.3* 7.5 7.3  ALBUMIN 3.5 3.0* 2.7*   No results for input(s): LIPASE, AMYLASE in the last 168 hours. No results for input(s): AMMONIA in the last 168 hours. Coagulation Profile: Recent Labs  Lab 06/06/2020 1112  INR 1.3*   Cardiac Enzymes: No results for input(s): CKTOTAL, CKMB, CKMBINDEX, TROPONINI in the last 168 hours. BNP (last 3 results) No results for input(s): PROBNP in the last 8760 hours. HbA1C: Recent Labs    06/17/20 0310  HGBA1C 6.0*   CBG: Recent Labs  Lab 05/27/2020 1114  GLUCAP 126*   Lipid Profile: Recent Labs    06/17/20 0310  CHOL 152  HDL 40*  LDLCALC 100*  TRIG 59  CHOLHDL 3.8   Thyroid Function Tests: No results for input(s): TSH, T4TOTAL, FREET4, T3FREE, THYROIDAB in the last 72 hours. Anemia Panel: No results for input(s): VITAMINB12, FOLATE, FERRITIN, TIBC, IRON, RETICCTPCT in the last 72 hours. Urine analysis:    Component Value Date/Time   COLORURINE YELLOW 06/05/2020 1129    APPEARANCEUR CLEAR 06/15/2020 1129   LABSPEC 1.008 06/07/2020 1129   PHURINE 6.0 06/12/2020 1129   GLUCOSEU NEGATIVE 05/27/2020 1129   HGBUR SMALL (A) 06/14/2020 1129   BILIRUBINUR NEGATIVE 06/15/2020 1129   KETONESUR NEGATIVE 06/02/2020 1129   PROTEINUR 30 (A) 06/20/2020 1129   NITRITE NEGATIVE 06/12/2020 1129   LEUKOCYTESUR NEGATIVE 06/04/2020 1129   Sepsis Labs: @LABRCNTIP (procalcitonin:4,lacticidven:4) ) Recent Results (from the past 240 hour(s))  Resp Panel by RT-PCR (Flu A&B, Covid) Nasopharyngeal Swab     Status: None   Collection Time: 06/14/2020 11:16 AM   Specimen: Nasopharyngeal Swab; Nasopharyngeal(NP) swabs in vial transport medium  Result Value Ref Range Status   SARS Coronavirus 2 by RT PCR NEGATIVE NEGATIVE Final    Comment: (NOTE) SARS-CoV-2 target nucleic acids are NOT DETECTED.  The SARS-CoV-2 RNA is generally detectable in upper respiratory specimens during the acute phase of infection. The lowest concentration of SARS-CoV-2 viral copies this assay can detect is 138 copies/mL. A negative result does not preclude SARS-Cov-2 infection and should not be used as the sole basis for treatment or other patient management decisions. A negative result may occur with  improper specimen collection/handling, submission of specimen other than nasopharyngeal swab, presence of viral mutation(s) within the areas targeted by this assay, and inadequate number of viral copies(<138 copies/mL). A negative result must be combined with clinical observations, patient history, and epidemiological information. The expected result is Negative.  Fact Sheet for Patients:  06/05/2020  Fact Sheet for Healthcare Providers:   This test is no t yet approved or cleared by the 06/05/2020 FDA and  has been authorized for detection and/or diagnosis of SARS-CoV-2 by FDA under an Emergency Use Authorization (EUA).  This EUA will remain  in effect (meaning this test can be used) for the duration of the COVID-19 declaration under Section 564(b)(1) of the Act, 21 U.S.C.section 360bbb-3(b)(1), unless the authorization is terminated  or revoked sooner.       Influenza A by PCR NEGATIVE NEGATIVE Final   Influenza B by PCR NEGATIVE NEGATIVE Final    Comment: (  NOTE) The Xpert Xpress SARS-CoV-2/FLU/RSV plus assay is intended as an aid in the diagnosis of influenza from Nasopharyngeal swab specimens and should not be used as a sole basis for treatment. Nasal washings and aspirates are unacceptable for Xpert Xpress SARS-CoV-2/FLU/RSV testing.  Fact Sheet for Patients: BloggerCourse.com  Fact Sheet for Healthcare Providers: SeriousBroker.it  This test is not yet approved or cleared by the [place] FDA and has been authorized for detection and/or diagnosis of SARS-CoV-2 by FDA under an Emergency Use Authorization (EUA). This EUA will remain in effect (meaning this test can be used) for the duration of the COVID-19 declaration under Section 564(b)(1) of the Act, 21 U.S.C. section 360bbb-3(b)(1), unless the authorization is terminated or revoked.  Performed at [facility], [address] [place], [place] [address]   MRSA PCR Screening     Status: None   Collection Time: 06/17/20 11:35 PM   Specimen: Nasal Mucosa; Nasopharyngeal  Result Value Ref Range Status   MRSA by PCR NEGATIVE NEGATIVE Final    Comment:        The GeneXpert MRSA Assay (FDA approved for NASAL specimens only), is one component of a comprehensive MRSA colonization surveillance program. It is not intended to diagnose MRSA infection nor to guide or monitor treatment for MRSA infections. Performed at [facility], [address], [place], [place] [address]          Radiology Studies: DG Chest 1 View  Result Date: 06/17/2020 CLINICAL DATA:   Chest pain, shortness of breath, elevated blood pressure EXAM: CHEST  1 VIEW COMPARISON:  Radiograph 06/11/2020 FINDINGS: Mild hyperinflation similar to comparison exam. No consolidation, features of edema, pneumothorax, or effusion. Pulmonary vascularity is normally distributed. The cardiomediastinal contours are unremarkable. No acute osseous or soft tissue abnormality. The cardiomediastinal contours are unremarkable. No acute osseous or soft tissue abnormality. Degenerative changes are present in the imaged spine and shoulders. Telemetry leads overlie the chest. IMPRESSION: No acute cardiopulmonary disease. Chronic hyperinflation. Electronically Signed   By: Kreg Shropshire M.D.   On: 06/17/2020 22:17   DG Chest 2 View  Result Date: 06/20/2020 CLINICAL DATA:  65 year old female with generalized weakness and back pain EXAM: CHEST - 2 VIEW COMPARISON:  None. FINDINGS: The heart size and mediastinal contours are within normal limits. Nipple shadows overlie the bilateral lungs. No focal consolidation. No pleural effusion. No pneumothorax. The visualized skeletal structures are unremarkable. IMPRESSION: No active cardiopulmonary disease. Electronically Signed   By: Maudry Mayhew MD   On: 05/26/2020 11:47   CT Head Wo Contrast  Result Date: 06/03/2020 CLINICAL DATA:  65 year old female with weakness, back pain, mental status change. Hypertensive and tachycardic at presentation. EXAM: CT HEAD WITHOUT CONTRAST TECHNIQUE: Contiguous axial images were obtained from the base of the skull through the vertex without intravenous contrast. COMPARISON:  None. FINDINGS: Brain: Confluent dystrophic calcification in the bilateral basal ganglia, deep cerebellar nuclei. No midline shift, ventriculomegaly, mass effect, evidence of mass lesion, intracranial hemorrhage or evidence of cortically based acute infarction. Mild for age patchy white matter hypodensity in the superior hemispheres. No cortical encephalomalacia  identified. Vascular: Calcified atherosclerosis at the skull base. No suspicious intracranial vascular hyperdensity. Skull: Negative. Sinuses/Orbits: Visualized paranasal sinuses and mastoids are clear. Other: No acute orbit or scalp soft tissue finding. IMPRESSION: 1. No acute intracranial abnormality. 2. 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 3. Mild for age nonspecific white matter changes. Electronically Signed  By: Odessa FlemingH  Hall M.D.   On: 06-08-20 11:55   CT Angio Aortobifemoral W and/or Wo Contrast  Result Date: 2020/09/15 CLINICAL DATA:  65 year old female with generalized weakness and back pain. EXAM: CT ANGIOGRAPHY OF ABDOMINAL AORTA WITH ILIOFEMORAL RUNOFF TECHNIQUE: Multidetector CT imaging of the abdomen, pelvis and lower extremities was performed using the standard protocol during bolus administration of intravenous contrast. Multiplanar CT image reconstructions and MIPs were obtained to evaluate the vascular anatomy. CONTRAST:  100mL OMNIPAQUE IOHEXOL 350 MG/ML SOLN COMPARISON:  08/16/2018 FINDINGS: VASCULAR Aorta: Normal caliber. Severe fibrofatty and calcific atherosclerotic changes, most prominent in the distal aorta where there is near circumferential mural plaque resulting in approximately 75% stenosis in the distal aorta. Celiac: Occluded. There is distal reconstitution via the GDA to supply the hepatic, left gastric, and splenic arteries. SMA: Occluded proximally.  Distal reconstitution. Renals: Moderate ostial stenosis of the single right renal artery secondary to atherosclerotic plaque. Mild stenosis of the proximal single left renal artery secondary to atherosclerotic plaque. IMA: Severe ostial stenosis secondary to atherosclerotic plaque. Patent distally including the arc of Riolan. RIGHT Lower Extremity Inflow: Total occlusion of the common iliac artery and indwelling common to external iliac artery stent. Distal  reconstitution in the distal external iliac artery. Outflow: Common femoral artery is patent proximally with severe distal stenosis secondary to atherosclerotic plaque. The profunda is patent but diminutive. The superficial femoral artery is diminutive proximally, gradually tapering to occlusion at the level of Hunter's canal read there is prominent atherosclerotic calcification. There is distal reconstitution in the distal P1 segment of the popliteal which is diminutive but patent throughout Runoff: Patent trifurcation proximally, each runoff vessel diminutive distally, limited evaluation. LEFT Lower Extremity Inflow: Severe multifocal stenoses of the left common iliac artery secondary to near circumferential and irregular atherosclerotic calcifications. The proximal internal iliac artery is occluded with distal reconstitution. The indwelling left common external iliac artery stent is patent. There is a focal occlusion in the native distal external iliac artery just beyond the distal aspect of the indwelling external iliac artery stent. There is distal reconstitution into the common femoral artery which is diminutive with advanced atherosclerotic calcifications. Outflow: The profunda is patent. The SFA his diminutive throughout with multifocal at least moderate stenosis secondary to atherosclerotic plaque. The popliteal artery is patent but diminutive. Runoff: Patent trifurcation. There is definite inline flow via the anterior tibial artery. The peroneal appears patent to the level of the ankle. The posterior tibial artery tapers proximally without definite inline flow to the foot. Veins: No obvious venous abnormality within the limitations of this arterial phase study. Review of the MIP images confirms the above findings. NON-VASCULAR Lower chest: No acute abnormality. Hepatobiliary: No focal liver abnormality is seen. No gallstones, gallbladder wall thickening, or biliary dilatation. Pancreas: Unremarkable. No  pancreatic ductal dilatation or surrounding inflammatory changes. Spleen: Normal in size without focal abnormality. Adrenals/Urinary Tract: Adrenal glands are unremarkable. Kidneys are normal, without renal calculi, focal lesion, or hydronephrosis. Bladder is unremarkable. Stomach/Bowel: Stomach is distended with enteric contents. Appendix is not definitively visualized. Moderate to large volume stool burden. The sigmoid colon is redundant. No evidence of bowel wall thickening, distention, or inflammatory changes. Lymphatic: No abdominopelvic lymphadenopathy. Reproductive: Uterus and bilateral adnexa are unremarkable. Other: Tiny fat containing umbilical hernia.  No ascites. Musculoskeletal: No acute or significant osseous findings. IMPRESSION: VASCULAR 1. Total occlusion of the common iliac artery and indwelling common to external iliac artery stent. Distal reconstitution in the distal external iliac artery.  Short segment chronic occlusion of the distal right superficial artery with reconstitution in the proximal popliteal artery. Patent right trifurcation proximally, limited evaluation of the runoff vessels distally. 2. 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 There is at least moderate stenosis of the native proximal left common iliac artery. The indwelling common external iliac artery stent is patent. Focal occlusion about the native distal left external iliac artery, just distal to the indwelling stent. Diminutive left SFA with at least moderate multifocal stenoses. Patent left trifurcation with inline flow to the foot via the anterior tibial artery. 3. Severe ostial stenosis of the inferior mesenteric artery secondary to atherosclerotic plaque. Unchanged chronic ostial occlusions of the celiac and superior mesenteric arteries. Patent arc of Riolan. NON-VASCULAR 1. Distended stomach with enteric contents. 2. Moderate to large stool  burden. Marliss Coots, MD [facility] Electronically Signed   By: Marliss Coots MD   On: 06/13/2020 14:36      Scheduled Meds: . amLODipine  10 mg Oral Daily  . aspirin EC  81 mg Oral Daily  . atorvastatin  40 mg Oral Daily  . carvedilol  12.5 mg Oral BID WC  . feeding supplement  237 mL Oral BID BM  . losartan  100 mg Oral Daily  . pantoprazole  40 mg Oral Daily  . sorbitol  60 mL Oral Once  . sorbitol, milk of mag, mineral oil, glycerin (SMOG) enema  960 mL Rectal Once   Continuous Infusions: . [START ON 06/28/2020]  ceFAZolin (ANCEF) IV    . heparin    . potassium chloride 10 mEq (06/21/2020 1024)     LOS: 1 day      Calvert Cantor, MD [facility] Pager: [email or web address] 06/20/2020, 10:51 AM

## 2020-06-18 NOTE — Progress Notes (Signed)
Patient had a second very large incontinent bowel movement, she had another large one this morning around 1130, and 2 small smears before this one. Enema held since she is pooping so much on her own

## 2020-06-18 NOTE — Progress Notes (Signed)
ANTICOAGULATION CONSULT NOTE  Pharmacy Consult for heparin infusion Indication: arterial occlusion  Allergies  Allergen Reactions  . Lisinopril Cough  . Lovastatin Cough    Patient Measurements: Height: 5\' 1"  (154.9 cm) Weight: 39.3 kg (86 lb 10.3 oz) IBW/kg (Calculated) : 47.8 Heparin Dosing Weight: 45.4 kg  Vital Signs: Temp: 98 F (36.7 C) (02/24 0747) Temp Source: Oral (02/24 0747) BP: 187/99 (02/24 0747) Pulse Rate: 77 (02/24 0747)  Labs: Recent Labs    06/06/2020 1112 06/17/20 0008 06/17/20 0310 06/17/20 0315 06/17/20 1102 06/17/20 1347 06/17/20 2158 July 07, 2020 0036  HGB 14.9  --  14.8 14.8 14.6  --   --  12.9  HCT 44.6  --  44.4 45.2 43.5  --   --  37.9  PLT 334  --  344 342 340  --   --  297  APTT 38*  --   --   --   --   --   --   --   LABPROT 15.5*  --   --   --   --   --   --   --   INR 1.3*  --   --   --   --   --   --   --   HEPARINUNFRC  --  0.54 0.48  --   --   --   --  0.44  CREATININE 0.58  --  0.54  --   --  0.42*  --  0.37*  TROPONINIHS  --   --   --   --   --   --  19* 22*    Estimated Creatinine Clearance: 43.5 mL/min (A) (by C-G formula based on SCr of 0.37 mg/dL (L)).   Medical History: Past Medical History:  Diagnosis Date  . Blind left eye   . Coronary artery disease   . GERD (gastroesophageal reflux disease)   . Hyperlipidemia   . Hypertension   . PAD (peripheral artery disease) (HCC)   . Stroke [facility]) 1997    Assessment: 64 yof continuing on heparin for arterial occlusion. Heparin level remains therapeutic this AM at 0.44. Hgb and PLT WNL. Patient is not on anticoagulation PTA. Vascular Surgery planning right axillobifemoral bypass graft, possibly as early as this Friday. No bleed issues reported. However hgb 14.6>>12.9. Will need to monitor CBC closely.     Goal of Therapy:  Heparin level 0.3-0.7 units/ml Monitor platelets by anticoagulation protocol: Yes   Plan:  Continue heparin at 800 units/hr Monitor daily heparin  level and CBC, s/sx bleeding F/u Vascular plans   Caidynce Muzyka A. Monday, PharmD, BCPS, FNKF Clinical Pharmacist [place] Please utilize Amion for appropriate phone number to reach the unit pharmacist [facility])   07/07/2020 8:00 AM

## 2020-06-18 NOTE — Progress Notes (Signed)
Per Elliot Cousin, RN on day shift, patient had multiple BMs, so enema was not given. This RN not sure if first enema was given and not second, or if neither enema was given. Butler Denmark, MD aware.Cathleen Corti, RN on dayshift 06/20/2020 notified.

## 2020-06-18 NOTE — Progress Notes (Signed)
   06/17/20 2000  Assess: MEWS Score  Temp 98.1 F (36.7 C)  BP (!) 180/101  Pulse Rate 89  ECG Heart Rate 90  Resp 12  SpO2 99 %  O2 Device Room Air  Patient Activity (if Appropriate) In bed  Assess: MEWS Score  MEWS Temp 0  MEWS Systolic 0  MEWS Pulse 0  MEWS RR 1  MEWS LOC 0  MEWS Score 1  MEWS Score Color Green  Treat  MEWS Interventions Administered prn meds/treatments  Pain Scale 0-10  Pain Score 10  Pain Type Acute pain  Pain Location Chest  Pain Orientation Medial  Pain Radiating Towards N/A  Pain Descriptors / Indicators Sharp  Pain Frequency Constant  Pain Onset Progressive  Patients Stated Pain Goal 0  Pain Intervention(s) Emotional support  Notify: Provider  Provider Name/Title Chotiner, MD  Date Provider Notified 06/17/20  Time Provider Notified 2005  Notification Type Page  Notification Reason Change in status  Provider response See new orders  Date of Provider Response 06/17/20  Time of Provider Response 2015 (MD called this RN.)  Document  Patient Outcome Stabilized after interventions   EKG was done. MD aware. Morphine and Zofran given IV per PRN order. Hydralazine not given per MD. Metoprolol IV ordered. Patient now denies chest pain. Will continue to monitor.

## 2020-06-18 NOTE — Progress Notes (Incomplete)
Progress Note  Patient Name: Cindy Hess Date of Encounter: 05/29/2020  [facility] Cardiologist: new to [place]   Subjective   65 year old female with a history of uncontrolled hypertension, prior stroke, medical noncompliance, and mild ongoing tobacco abuse, severe peripheral arterial disease who was seen last night by Dr. Okey Dupre for episodes of chest pain and some EKG changes.  Her troponin levels have remained flat with a peak of 22.  Blood pressure remains moderately elevated.  Echocardiogram has been ordered.  Inpatient Medications    Scheduled Meds: . amLODipine  10 mg Oral Daily  . aspirin EC  81 mg Oral Daily  . atorvastatin  40 mg Oral Daily  . carvedilol  25 mg Oral BID WC  . feeding supplement  237 mL Oral BID BM  . losartan  100 mg Oral Daily  . pantoprazole  40 mg Oral Daily  . sorbitol  60 mL Oral Once  . sorbitol, milk of mag, mineral oil, glycerin (SMOG) enema  960 mL Rectal Once   Continuous Infusions: . [START ON 22-Jun-2020]  ceFAZolin (ANCEF) IV    . heparin    . potassium chloride 10 mEq (05/26/2020 1024)   PRN Meds: acetaminophen **OR** acetaminophen, hydrALAZINE, oxyCODONE-acetaminophen **OR** morphine injection, ondansetron **OR** ondansetron (ZOFRAN) IV, polyethylene glycol   Vital Signs    Vitals:   06/03/2020 0400 05/30/2020 0506 05/28/2020 0746 06/14/2020 0747  BP: (!) 180/103 (!) 195/99 (!) 187/99 (!) 187/99  Pulse: 78 71 80 77  Resp: 19 19  14   Temp: 98.5 F (36.9 C)   98 F (36.7 C)  TempSrc: Oral   Oral  SpO2: 100% 100%  100%  Weight:      Height:        Intake/Output Summary (Last 24 hours) at 06/04/2020 1107 Last data filed at 06/02/2020 0700 Gross per 24 hour  Intake 600.81 ml  Output 600 ml  Net 0.81 ml   Last 3 Weights 06/17/2020 05/29/2020 10/07/2019  Weight (lbs) 86 lb 10.3 oz 100 lb 103 lb  Weight (kg) 39.3 kg 45.36 kg 46.72 kg      Telemetry    Sinus rhythm  - Personally Reviewed  ECG    NSR , ns ST abnl  - Personally  Reviewed  Physical Exam   GEN thin middle age female,   Uncomfortable  Neck: No JVD Cardiac: RRR, no murmurs, rubs, or gallops.  Respiratory: Clear to auscultation bilaterally. GI: Soft, nontender, non-distended  MS: No edema; No deformity. Neuro:  Nonfocal  Psych: Normal affect   Labs    High Sensitivity Troponin:   Recent Labs  Lab 06/17/20 2158 05/27/2020 0036  TROPONINIHS 19* 22*      Chemistry Recent Labs  Lab 05/26/2020 1112 06/17/20 0310 06/17/20 1347 06/08/2020 0036  NA 139 140 138 135  K 3.6 3.3* 3.7 3.4*  CL 98 102 101 103  CO2 26 22 24 22   GLUCOSE 126* 140* 123* 108*  BUN 13 13 11 9   CREATININE 0.58 0.54 0.42* 0.37*  CALCIUM 9.5 9.5 9.2 8.6*  PROT 8.3* 7.5 7.3  --   ALBUMIN 3.5 3.0* 2.7*  --   AST 19 22 19   --   ALT 16 17 13   --   ALKPHOS 98 94 93  --   BILITOT 1.0 0.9 0.8  --   GFRNONAA >60 >60 >60 >60  ANIONGAP 15 16* 13 10     Hematology Recent Labs  Lab 06/17/20 0315 06/17/20 1102 06/12/2020 0036  WBC 23.0* 25.5* 19.7*  RBC 5.23* 5.11 4.47  HGB 14.8 14.6 12.9  HCT 45.2 43.5 37.9  MCV 86.4 85.1 84.8  MCH 28.3 28.6 28.9  MCHC 32.7 33.6 34.0  RDW 13.5 13.4 13.4  PLT 342 340 297    BNPNo results for input(s): BNP, PROBNP in the last 168 hours.   DDimer No results for input(s): DDIMER in the last 168 hours.   Radiology    DG Chest 1 View  Result Date: 06/17/2020 CLINICAL DATA:  Chest pain, shortness of breath, elevated blood pressure EXAM: CHEST  1 VIEW COMPARISON:  Radiograph 06/11/2020 FINDINGS: Mild hyperinflation similar to comparison exam. No consolidation, features of edema, pneumothorax, or effusion. Pulmonary vascularity is normally distributed. The cardiomediastinal contours are unremarkable. No acute osseous or soft tissue abnormality. The cardiomediastinal contours are unremarkable. No acute osseous or soft tissue abnormality. Degenerative changes are present in the imaged spine and shoulders. Telemetry leads overlie the chest.  IMPRESSION: No acute cardiopulmonary disease. Chronic hyperinflation. Electronically Signed   By: Kreg Shropshire M.D.   On: 06/17/2020 22:17   DG Chest 2 View  Result Date: 05/31/2020 CLINICAL DATA:  65 year old female with generalized weakness and back pain EXAM: CHEST - 2 VIEW COMPARISON:  None. FINDINGS: The heart size and mediastinal contours are within normal limits. Nipple shadows overlie the bilateral lungs. No focal consolidation. No pleural effusion. No pneumothorax. The visualized skeletal structures are unremarkable. IMPRESSION: No active cardiopulmonary disease. Electronically Signed   By: Maudry Mayhew MD   On: 06/05/2020 11:47   CT Head Wo Contrast  Result Date: 06/15/2020 CLINICAL DATA:  65 year old female with weakness, back pain, mental status change. Hypertensive and tachycardic at presentation. EXAM: CT HEAD WITHOUT CONTRAST TECHNIQUE: Contiguous axial images were obtained from the base of the skull through the vertex without intravenous contrast. COMPARISON:  None. FINDINGS: Brain: Confluent dystrophic calcification in the bilateral basal ganglia, deep cerebellar nuclei. No midline shift, ventriculomegaly, mass effect, evidence of mass lesion, intracranial hemorrhage or evidence of cortically based acute infarction. Mild for age patchy white matter hypodensity in the superior hemispheres. No cortical encephalomalacia identified. Vascular: Calcified atherosclerosis at the skull base. No suspicious intracranial vascular hyperdensity. Skull: Negative. Sinuses/Orbits: Visualized paranasal sinuses and mastoids are clear. Other: No acute orbit or scalp soft tissue finding. IMPRESSION: 1. No acute intracranial abnormality. 2. 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 3. Mild for age nonspecific white matter changes. Electronically Signed   By: Odessa Fleming M.D.   On: 06/05/2020 11:55   CT Angio Aortobifemoral W and/or Wo  Contrast  Result Date: 05/28/2020 CLINICAL DATA:  65 year old female with generalized weakness and back pain. EXAM: CT ANGIOGRAPHY OF ABDOMINAL AORTA WITH ILIOFEMORAL RUNOFF TECHNIQUE: Multidetector CT imaging of the abdomen, pelvis and lower extremities was performed using the standard protocol during bolus administration of intravenous contrast. Multiplanar CT image reconstructions and MIPs were obtained to evaluate the vascular anatomy. CONTRAST:  OMNIPAQUE IOHEXOL 350 MG/ML SOLN COMPARISON:  08/16/2018 FINDINGS: VASCULAR Aorta: Normal caliber. Severe fibrofatty and calcific atherosclerotic changes, most prominent in the distal aorta where there is near circumferential mural plaque resulting in approximately 75% stenosis in the distal aorta. Celiac: Occluded. There is distal reconstitution via the GDA to supply the hepatic, left gastric, and splenic arteries. SMA: Occluded proximally.  Distal reconstitution. Renals: Moderate ostial stenosis of the single right renal artery secondary to atherosclerotic plaque. Mild stenosis of the proximal single left renal  artery secondary to atherosclerotic plaque. IMA: Severe ostial stenosis secondary to atherosclerotic plaque. Patent distally including the arc of Riolan. RIGHT Lower Extremity Inflow: Total occlusion of the common iliac artery and indwelling common to external iliac artery stent. Distal reconstitution in the distal external iliac artery. Outflow: Common femoral artery is patent proximally with severe distal stenosis secondary to atherosclerotic plaque. The profunda is patent but diminutive. The superficial femoral artery is diminutive proximally, gradually tapering to occlusion at the level of Hunter's canal read there is prominent atherosclerotic calcification. There is distal reconstitution in the distal P1 segment of the popliteal which is diminutive but patent throughout Runoff: Patent trifurcation proximally, each runoff vessel diminutive distally,  limited evaluation. LEFT Lower Extremity Inflow: Severe multifocal stenoses of the left common iliac artery secondary to near circumferential and irregular atherosclerotic calcifications. The proximal internal iliac artery is occluded with distal reconstitution. The indwelling left common external iliac artery stent is patent. There is a focal occlusion in the native distal external iliac artery just beyond the distal aspect of the indwelling external iliac artery stent. There is distal reconstitution into the common femoral artery which is diminutive with advanced atherosclerotic calcifications. Outflow: The profunda is patent. The SFA his diminutive throughout with multifocal at least moderate stenosis secondary to atherosclerotic plaque. The popliteal artery is patent but diminutive. Runoff: Patent trifurcation. There is definite inline flow via the anterior tibial artery. The peroneal appears patent to the level of the ankle. The posterior tibial artery tapers proximally without definite inline flow to the foot. Veins: No obvious venous abnormality within the limitations of this arterial phase study. Review of the MIP images confirms the above findings. NON-VASCULAR Lower chest: No acute abnormality. Hepatobiliary: No focal liver abnormality is seen. No gallstones, gallbladder wall thickening, or biliary dilatation. Pancreas: Unremarkable. No pancreatic ductal dilatation or surrounding inflammatory changes. Spleen: Normal in size without focal abnormality. Adrenals/Urinary Tract: Adrenal glands are unremarkable. Kidneys are normal, without renal calculi, focal lesion, or hydronephrosis. Bladder is unremarkable. Stomach/Bowel: Stomach is distended with enteric contents. Appendix is not definitively visualized. Moderate to large volume stool burden. The sigmoid colon is redundant. No evidence of bowel wall thickening, distention, or inflammatory changes. Lymphatic: No abdominopelvic lymphadenopathy. Reproductive:  Uterus and bilateral adnexa are unremarkable. Other: Tiny fat containing umbilical hernia.  No ascites. Musculoskeletal: No acute or significant osseous findings. IMPRESSION: VASCULAR 1. Total occlusion of the common iliac artery and indwelling common to external iliac artery stent. Distal reconstitution in the distal external iliac artery. Short segment chronic occlusion of the distal right superficial artery with reconstitution in the proximal popliteal artery. Patent right trifurcation proximally, limited evaluation of the runoff vessels distally. 2. 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 There is at least moderate stenosis of the native proximal left common iliac artery. The indwelling common external iliac artery stent is patent. Focal occlusion about the native distal left external iliac artery, just distal to the indwelling stent. Diminutive left SFA with at least moderate multifocal stenoses. Patent left trifurcation with inline flow to the foot via the anterior tibial artery. 3. Severe ostial stenosis of the inferior mesenteric artery secondary to atherosclerotic plaque. Unchanged chronic ostial occlusions of the celiac and superior mesenteric arteries. Patent arc of Riolan. NON-VASCULAR 1. Distended stomach with enteric contents. 2. Moderate to large stool burden. Marliss Cootsylan Suttle, MD [facility] Electronically Signed   By: Marliss Cootsylan  Suttle MD  On: 06/01/2020 14:36    Cardiac Studies      Patient Profile     65 y.o. female wit severe PAD, HTN  Assessment & Plan    1.   HTN:   Pt very likely has renal artery stenosis.  She has severe PAD with an ischemic leg and partialy ischemic bowel. She was very hypertensive  I have discussed with Dr. Butler Denmark and there are really no good solutions  We can try clonidine for her HTN  Her prognosis is very poor .  She is  not a candidate for cath or any invasive procedures      For questions or updates, please contact [facility] Please consult [email or web address] for contact info under        Signed, Kristeen Miss, MD  06/08/2020, 11:07 AM

## 2020-06-18 NOTE — Significant Event (Signed)
Rapid Response Event Note  Reason for Call : Decreased LOC  Initial Focused Assessment:  I was notified by nursing staff pt is less responsive and nonverbal. Not responding to noxious stimuli. Pt received Percocet 5mg /325mg  po at 2021. Upon my arrival, pt agonally breathing and not responsive to noxious stimuli. CBG 51. D 50 1amp given. Narcan 0.4mg  IV given. Pt became bradycardic and we immediately initiated rescue breaths with BVM. Code blue was called. Narcan 0.4 mg IV repeated. No loss of pulse. Pt began spontaneously breathing independently. Within 5-10 minutes, pt is talking and answering questions. BP initially was 77/41 so NS bolus was initiated. Code team, Dr. 7-10 and PCCM came to bedside.  Since pt was returning to baseline, decision was made to watch patient in current setting.   2300-HR 108 ST, 94/41 (50), RR 25 sats 100% on NRB at 15L  Interventions:  -D50 1 amp -Narcan 0.4mg  IV x 2 doses -NS bolus  Plan of Care:  -Monitor mental status and repeat Narcan if pt becomes somulent and has depressed respiratory drive   MD Notified: Dr. Yolanda Bonine Call Time: 2241 Arrival Time: 2244 Code Blue activated: 2253 End Time: 2315  2316, RN

## 2020-06-18 NOTE — Progress Notes (Signed)
RT NOTE:  Sample collected and labeled.  Lab notified of STAT ABG  

## 2020-06-18 NOTE — Progress Notes (Signed)
Initial Nutrition Assessment  DOCUMENTATION CODES:   Underweight  INTERVENTION:  Provide Ensure Enlive po TID, each supplement provides 350 kcal and 20 grams of protein.  Encourage adequate PO intake.  NUTRITION DIAGNOSIS:   Inadequate oral intake related to altered GI function as evidenced by per patient/family report.  GOAL:   Patient will meet greater than or equal to 90% of their needs  MONITOR:   PO intake,Supplement acceptance,Skin,Weight trends,Labs,I & O's  REASON FOR ASSESSMENT:   Malnutrition Screening Tool    ASSESSMENT:   65 year old female with hypertension, severe peripheral artery disease, coronary artery disease status post stenting, hyperlipidemia, and GERD presents severe pain and weakness. CTA was performed which revealed total acute occlusion of the common iliac artery illusion of a prior stent & chronic occlusion of the right distal iliac artery. Pt admitted for peripheral artery disease along with a hypertensive urgency   Pt unavailable during attempted time of contact. RD unable to obtain pt nutrition history at this time. Per MD, pt with poor appetite and intake. Pt currently has Ensure ordered and has been consuming them. RD to increase Ensure to TID to aid in increased caloric and protein needs. Unable to complete Nutrition-Focused physical exam at this time. Pt reports abdominal tightness. CT reveals that the stomach is distended with food and she has a large amount of stool in bowel. Enemas have been ordered per MD.   Labs and medications reviewed.   Diet Order:   Diet Order            Diet NPO time specified Except for: Sips with Meds  Diet effective midnight           Diet Heart Room service appropriate? Yes with Assist; Fluid consistency: Thin  Diet effective now                 EDUCATION NEEDS:   Not appropriate for education at this time  Skin:  Skin Assessment: Reviewed RN Assessment  Last BM:  2/23  Height:   Ht Readings  from Last 1 Encounters:  06/17/20 5\' 1"  (1.549 m)    Weight:   Wt Readings from Last 1 Encounters:  06/17/20 39.3 kg   BMI:  Body mass index is 16.37 kg/m.  Estimated Nutritional Needs:   Kcal:  1500-1700  Protein:  75-85 grams  Fluid:  >/= 1.5 L/day  06/22/2020, MS, RD, LDN RD pager number/after hours weekend pager number on Amion.

## 2020-06-18 NOTE — Code Documentation (Signed)
  Patient Name: Cindy Hess   MRN: [id number]   Date of Birth/ Sex: 04-Nov-1955 , female      Admission Date: 07/03/20  Attending Provider: Calvert Cantor, MD  Primary Diagnosis: Hypertensive urgency   Indication: Pt was in her usual state of health until this PM, when she was noted to be in respiratory distress. Code blue was subsequently called. At the time of arrival on scene, ACLS protocol was underway.  Miscellaneous Information:  - Labs sent, including: [facility] labs, ABG, Lactic  - Primary team notified?  Yes  - Family Notified? Not at the time   - Additional notes/ transfer status: Responded to a code blue for a patient with agonal breathing. She never lost her pulse, and CPR was not initiated. Primary team and Rapid response were at bedside. Patient was status post receiving 0.8 mg of Narcan and was altered with concern for not being able to protect her airway. Primary notified PCCM for intubation and transfer to the ICU. Patient became more responsive in the interim, and was protecting her airway when PCCM arrived. No transfer at this time.       Dolan Amen, MD  05/26/2020, 11:27 PM

## 2020-06-18 NOTE — Progress Notes (Addendum)
Called by RN that Ms. Schroeck was found to be unresponsive with low blood pressure and decreased respiration rate.  She did not arouse to sternal rub.  She did receive narcotic pain medication a few hours ago and has been given Narcan.  She has had a total of 8 mg of Narcan but is still unresponsive and not protecting airway.  She never lost pulse.  Blood pressure is 77/52 and she has been giving IV fluid bolus.  CRNA arrived at the room and will intubate patient as she is unable to protect her airway. PCCM consutled to evaluate patient.  Pt woke up and became responsive before was intubated and is now protecting her airway.  BP is improved with IVF bolus. BP now 150/70's. Pt complaining of pain in back.  ABG shows acidosis with pH of 7.27.  Awaiting other labs including lactic acid.  Provide dose of toradol for pain control. PCCM recommends continuing to monitor on progressive care as not intubated.  Will continue to monitor

## 2020-06-18 NOTE — Progress Notes (Signed)
VASCULAR SURGERY ASSESSMENT & PLAN:   AORTOILIAC OCCLUSIVE DISEASE: This patient continues to have no Doppler flow in the right foot.  I think without revascularization she will ultimately require amputation on the right.  As per my note yesterday I think her only option for revascularization is a right axillobifemoral bypass.  I think aortofemoral bypass grafting would likely result in catastrophic bowel ischemia as she would lose perfusion to her IMA and left hypogastric artery.  Both external iliac arteries are occluded.  I had a long discussion this morning with the patient about the indications for revascularization and the potential risks including the risk of bleeding, wound healing problems, infection, and limb loss.  She is agreeable to proceed.  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  Her perfusion in the intestine is being maintained by the IMA and a left hypogastric artery.  There is a small chance that asked low bifemoral bypass grafting may slightly improve collateral flow to the intestine via collaterals from the deep femoral arteries.  However beyond this I do not see any options to address her celiac axis and SMA occlusion.  Given the diffuse nature of the disease she is not a candidate for an endovascular approach.  She is not a candidate for a super celiac to SMA bypass given that there is no good distal target.  POORLY CONTROLLED HYPERTENSION: Her blood pressure is under better control.  However given her diffuse disease Dr. Butler Denmark does not want to overshoot on her blood pressure control.  I had a long discussion with her yesterday about this complicated case.  CHEST PAIN: She did have some chest pain last night but cardiology evaluated the patient and did not think that she needed any further work-up or treatment for acute coronary syndrome.  We have ordered a repeat EKG today.   SUBJECTIVE:   No change in the symptoms in her  right foot which has paresthesias and weakness.  I did question her in more detail today about abdominal pain.  She states that she occasionally gets postprandial abdominal pain but this does not happen consistently.  She is not clearly having symptoms from chronic mesenteric ischemia on my history.  Certainly however she is at high risk for complications related to her diffuse mesenteric artery occlusive disease.  PHYSICAL EXAM:   Vitals:   07/05/2020 0400 07/05/20 0506 July 05, 2020 0746 07-05-20 0747  BP: (!) 180/103 (!) 195/99 (!) 187/99 (!) 187/99  Pulse: 78 71 80 77  Resp: 19 19  14   Temp: 98.5 F (36.9 C)   98 F (36.7 C)  TempSrc: Oral   Oral  SpO2: 100% 100%  100%  Weight:      Height:       Reasonable Doppler signals in the left dorsalis pedis position. No Doppler flow in the right foot.  LABS:   Lab Results  Component Value Date   WBC 19.7 (H) Jul 05, 2020   HGB 12.9 07/05/20   HCT 37.9 Jul 05, 2020   MCV 84.8 07-05-20   PLT 297 07/05/2020   Lab Results  Component Value Date   CREATININE 0.37 (L) 07-05-20   Lab Results  Component Value Date   INR 1.3 (H) 06/02/2020   CBG (last 3)  Recent Labs    06/21/2020 1114  GLUCAP 126*    PROBLEM LIST:    Principal Problem:   Hypertensive urgency Active Problems:   Nicotine dependence, cigarettes, uncomplicated   Coronary artery disease involving  native coronary artery of native heart without angina pectoris   Mixed hyperlipidemia   Iliac artery occlusion, bilateral (HCC)   GERD without esophagitis   Ischemic leg pain   Intractable vomiting   CURRENT MEDS:   . amLODipine  10 mg Oral Daily  . aspirin EC  81 mg Oral Daily  . atorvastatin  40 mg Oral Daily  . carvedilol  12.5 mg Oral BID WC  . feeding supplement  237 mL Oral BID BM  . losartan  100 mg Oral Daily  . pantoprazole  40 mg Oral Daily    [name] Office: [phone] July 03, 2020

## 2020-06-19 ENCOUNTER — Inpatient Hospital Stay (HOSPITAL_COMMUNITY): Payer: Medicare Other

## 2020-06-19 ENCOUNTER — Encounter (HOSPITAL_COMMUNITY): Admission: EM | Disposition: E | Payer: Self-pay | Source: Home / Self Care | Attending: Internal Medicine

## 2020-06-19 DIAGNOSIS — I160 Hypertensive urgency: Secondary | ICD-10-CM | POA: Diagnosis not present

## 2020-06-19 DIAGNOSIS — R579 Shock, unspecified: Secondary | ICD-10-CM | POA: Diagnosis not present

## 2020-06-19 DIAGNOSIS — K559 Vascular disorder of intestine, unspecified: Secondary | ICD-10-CM

## 2020-06-19 DIAGNOSIS — Z515 Encounter for palliative care: Secondary | ICD-10-CM

## 2020-06-19 DIAGNOSIS — I998 Other disorder of circulatory system: Secondary | ICD-10-CM | POA: Diagnosis not present

## 2020-06-19 DIAGNOSIS — K55059 Acute (reversible) ischemia of intestine, part and extent unspecified: Secondary | ICD-10-CM

## 2020-06-19 DIAGNOSIS — M79606 Pain in leg, unspecified: Secondary | ICD-10-CM

## 2020-06-19 DIAGNOSIS — I745 Embolism and thrombosis of iliac artery: Secondary | ICD-10-CM | POA: Diagnosis not present

## 2020-06-19 LAB — BASIC METABOLIC PANEL
Anion gap: 20 — ABNORMAL HIGH (ref 5–15)
Anion gap: 23 — ABNORMAL HIGH (ref 5–15)
BUN: 20 mg/dL (ref 8–23)
BUN: 21 mg/dL (ref 8–23)
CO2: 10 mmol/L — ABNORMAL LOW (ref 22–32)
CO2: 9 mmol/L — ABNORMAL LOW (ref 22–32)
Calcium: 8.7 mg/dL — ABNORMAL LOW (ref 8.9–10.3)
Calcium: 8.8 mg/dL — ABNORMAL LOW (ref 8.9–10.3)
Chloride: 105 mmol/L (ref 98–111)
Chloride: 110 mmol/L (ref 98–111)
Creatinine, Ser: 1.13 mg/dL — ABNORMAL HIGH (ref 0.44–1.00)
Creatinine, Ser: 1.14 mg/dL — ABNORMAL HIGH (ref 0.44–1.00)
GFR, Estimated: 53 mL/min — ABNORMAL LOW (ref >60)
GFR, Estimated: 54 mL/min — ABNORMAL LOW (ref >60)
Glucose, Bld: 330 mg/dL — ABNORMAL HIGH (ref 70–99)
Glucose, Bld: 34 mg/dL — CL (ref 70–99)
Potassium: 4.5 mmol/L (ref 3.5–5.1)
Potassium: 4.6 mmol/L (ref 3.5–5.1)
Sodium: 135 mmol/L (ref 135–145)
Sodium: 142 mmol/L (ref 135–145)

## 2020-06-19 LAB — BLOOD GAS, ARTERIAL
Acid-base deficit: 20 mmol/L — ABNORMAL HIGH (ref 0.0–2.0)
Bicarbonate: 6.9 mmol/L — ABNORMAL LOW (ref 20.0–28.0)
Drawn by: 36496
FIO2: 100
O2 Saturation: 98.9 %
Patient temperature: 37
pCO2 arterial: <19 mmHg — CL (ref 32.0–48.0)
pH, Arterial: 7.185 — CL (ref 7.350–7.450)
pO2, Arterial: 429 mmHg — ABNORMAL HIGH (ref 83.0–108.0)

## 2020-06-19 LAB — CBC
HCT: 35.8 % — ABNORMAL LOW (ref 36.0–46.0)
Hemoglobin: 11.8 g/dL — ABNORMAL LOW (ref 12.0–15.0)
MCH: 29.1 pg (ref 26.0–34.0)
MCHC: 33 g/dL (ref 30.0–36.0)
MCV: 88.4 fL (ref 80.0–100.0)
Platelets: 232 10*3/uL (ref 150–400)
RBC: 4.05 MIL/uL (ref 3.87–5.11)
RDW: 13.9 % (ref 11.5–15.5)
WBC: 18.4 10*3/uL — ABNORMAL HIGH (ref 4.0–10.5)
nRBC: 0.4 % — ABNORMAL HIGH (ref 0.0–0.2)

## 2020-06-19 LAB — HEPARIN LEVEL (UNFRACTIONATED): Heparin Unfractionated: 0.47 IU/mL (ref 0.30–0.70)

## 2020-06-19 LAB — PROTIME-INR
INR: 2.6 — ABNORMAL HIGH (ref 0.8–1.2)
Prothrombin Time: 26.7 seconds — ABNORMAL HIGH (ref 11.4–15.2)

## 2020-06-19 LAB — COMPREHENSIVE METABOLIC PANEL
ALT: 712 U/L — ABNORMAL HIGH (ref 0–44)
AST: 1637 U/L — ABNORMAL HIGH (ref 15–41)
Albumin: 1.8 g/dL — ABNORMAL LOW (ref 3.5–5.0)
Alkaline Phosphatase: 77 U/L (ref 38–126)
Anion gap: 27 — ABNORMAL HIGH (ref 5–15)
BUN: 20 mg/dL (ref 8–23)
CO2: 11 mmol/L — ABNORMAL LOW (ref 22–32)
Calcium: 8.7 mg/dL — ABNORMAL LOW (ref 8.9–10.3)
Chloride: 103 mmol/L (ref 98–111)
Creatinine, Ser: 1.27 mg/dL — ABNORMAL HIGH (ref 0.44–1.00)
GFR, Estimated: 47 mL/min — ABNORMAL LOW (ref >60)
Glucose, Bld: 165 mg/dL — ABNORMAL HIGH (ref 70–99)
Potassium: 4 mmol/L (ref 3.5–5.1)
Sodium: 141 mmol/L (ref 135–145)
Total Bilirubin: 1 mg/dL (ref 0.3–1.2)
Total Protein: 4.9 g/dL — ABNORMAL LOW (ref 6.5–8.1)

## 2020-06-19 LAB — CBC WITH DIFFERENTIAL/PLATELET
Abs Immature Granulocytes: 0.3 10*3/uL — ABNORMAL HIGH (ref 0.00–0.07)
Band Neutrophils: 38 %
Basophils Absolute: 0 10*3/uL (ref 0.0–0.1)
Basophils Relative: 0 %
Eosinophils Absolute: 0 10*3/uL (ref 0.0–0.5)
Eosinophils Relative: 0 %
HCT: 34.7 % — ABNORMAL LOW (ref 36.0–46.0)
Hemoglobin: 11.1 g/dL — ABNORMAL LOW (ref 12.0–15.0)
Lymphocytes Relative: 9 %
Lymphs Abs: 1.2 10*3/uL (ref 0.7–4.0)
MCH: 28.7 pg (ref 26.0–34.0)
MCHC: 32 g/dL (ref 30.0–36.0)
MCV: 89.7 fL (ref 80.0–100.0)
Metamyelocytes Relative: 1 %
Monocytes Absolute: 1 10*3/uL (ref 0.1–1.0)
Monocytes Relative: 8 %
Myelocytes: 1 %
Neutro Abs: 10.6 10*3/uL — ABNORMAL HIGH (ref 1.7–7.7)
Neutrophils Relative %: 43 %
Platelets: 213 10*3/uL (ref 150–400)
RBC: 3.87 MIL/uL (ref 3.87–5.11)
RDW: 14.3 % (ref 11.5–15.5)
WBC: 13.1 10*3/uL — ABNORMAL HIGH (ref 4.0–10.5)
nRBC: 0.8 % — ABNORMAL HIGH (ref 0.0–0.2)

## 2020-06-19 LAB — GLUCOSE, CAPILLARY
Glucose-Capillary: 24 mg/dL — CL (ref 70–99)
Glucose-Capillary: 358 mg/dL — ABNORMAL HIGH (ref 70–99)
Glucose-Capillary: 208 mg/dL — ABNORMAL HIGH (ref 70–99)
Glucose-Capillary: 274 mg/dL — ABNORMAL HIGH (ref 70–99)
Glucose-Capillary: 369 mg/dL — ABNORMAL HIGH (ref 70–99)
Glucose-Capillary: 155 mg/dL — ABNORMAL HIGH (ref 70–99)

## 2020-06-19 LAB — LACTIC ACID, PLASMA
Lactic Acid, Venous: >11 mmol/L (ref 0.5–1.9)
Lactic Acid, Venous: >11 mmol/L (ref 0.5–1.9)

## 2020-06-19 LAB — PROCALCITONIN: Procalcitonin: 6.87 ng/mL

## 2020-06-19 LAB — PREPARE RBC (CROSSMATCH)

## 2020-06-19 SURGERY — CREATION, BYPASS, ARTERIAL, AXILLARY TO BILATERAL FEMORAL, USING GRAFT
Anesthesia: General | Laterality: Right

## 2020-06-19 MED ORDER — HALOPERIDOL LACTATE 2 MG/ML PO CONC
0.5000 mg | ORAL | Status: DC | PRN
  Filled 2020-06-19: qty 0.3

## 2020-06-19 MED ORDER — DIPHENHYDRAMINE HCL 50 MG/ML IJ SOLN: 25.0000 mg | INTRAMUSCULAR | Status: DC | PRN

## 2020-06-19 MED ORDER — MIDAZOLAM HCL 2 MG/2ML IJ SOLN
INTRAMUSCULAR | Status: AC
  Administered 2020-06-19: 2 mg via INTRAVENOUS
  Filled 2020-06-19: qty 2

## 2020-06-19 MED ORDER — ACETAMINOPHEN 325 MG PO TABS: 650.0000 mg | ORAL_TABLET | Freq: Once | ORAL | Status: DC

## 2020-06-19 MED ORDER — ONDANSETRON HCL 4 MG/2ML IJ SOLN
4.0000 mg | INTRAMUSCULAR | Status: DC
  Administered 2020-06-19 (×2): 4 mg via INTRAVENOUS
  Filled 2020-06-19 (×2): qty 2

## 2020-06-19 MED ORDER — SODIUM CHLORIDE 0.9 % IV SOLN: 2.0000 g | Freq: Two times a day (BID) | INTRAVENOUS | Status: DC

## 2020-06-19 MED ORDER — DIPHENHYDRAMINE HCL 50 MG/ML IJ SOLN: 25.0000 mg | Freq: Once | INTRAMUSCULAR | Status: DC

## 2020-06-19 MED ORDER — CHLORHEXIDINE GLUCONATE CLOTH 2 % EX PADS: 6.0000 | MEDICATED_PAD | Freq: Every day | CUTANEOUS | Status: DC

## 2020-06-19 MED ORDER — SODIUM BICARBONATE 8.4 % IV SOLN
50.0000 meq | Freq: Once | INTRAVENOUS | Status: AC
  Administered 2020-06-19: 50 meq via INTRAVENOUS
  Filled 2020-06-19: qty 50

## 2020-06-19 MED ORDER — SODIUM CHLORIDE 0.9% IV SOLUTION: Freq: Once | INTRAVENOUS | Status: DC

## 2020-06-19 MED ORDER — HALOPERIDOL LACTATE 5 MG/ML IJ SOLN: 0.5000 mg | INTRAMUSCULAR | Status: DC | PRN

## 2020-06-19 MED ORDER — MIDAZOLAM HCL 2 MG/2ML IJ SOLN
INTRAMUSCULAR | Status: AC
  Administered 2020-06-19: 1 mg via INTRAVENOUS
  Filled 2020-06-19: qty 2

## 2020-06-19 MED ORDER — BIOTENE DRY MOUTH MT LIQD: 15.0000 mL | OROMUCOSAL | Status: DC | PRN

## 2020-06-19 MED ORDER — SODIUM CHLORIDE 0.9 % IV SOLN
1.0000 mg/h | INTRAVENOUS | Status: DC
  Administered 2020-06-19: 1 mg/h via INTRAVENOUS
  Filled 2020-06-19: qty 2.5

## 2020-06-19 MED ORDER — POLYVINYL ALCOHOL 1.4 % OP SOLN
1.0000 [drp] | Freq: Four times a day (QID) | OPHTHALMIC | Status: DC | PRN
  Filled 2020-06-19: qty 15

## 2020-06-19 MED ORDER — NOREPINEPHRINE 4 MG/250ML-% IV SOLN: 2.0000 ug/min | INTRAVENOUS | Status: DC

## 2020-06-19 MED ORDER — HALOPERIDOL 0.5 MG PO TABS
0.5000 mg | ORAL_TABLET | ORAL | Status: DC | PRN
  Filled 2020-06-19: qty 1

## 2020-06-19 MED ORDER — MIDAZOLAM HCL 2 MG/2ML IJ SOLN: 2.0000 mg | Freq: Once | INTRAMUSCULAR | Status: AC

## 2020-06-19 MED ORDER — LORAZEPAM 2 MG/ML IJ SOLN: 2.0000 mg | INTRAMUSCULAR | Status: DC | PRN

## 2020-06-19 MED ORDER — POLYVINYL ALCOHOL 1.4 % OP SOLN
1.0000 [drp] | Freq: Four times a day (QID) | OPHTHALMIC | Status: DC | PRN
Start: 2020-06-19 — End: 2020-06-20
  Filled 2020-06-19: qty 15

## 2020-06-19 MED ORDER — GLYCOPYRROLATE 0.2 MG/ML IJ SOLN: 0.2000 mg | INTRAMUSCULAR | Status: DC | PRN

## 2020-06-19 MED ORDER — LACTATED RINGERS IV BOLUS
1000.0000 mL | Freq: Once | INTRAVENOUS | Status: AC
  Administered 2020-06-19: 1000 mL via INTRAVENOUS

## 2020-06-19 MED ORDER — GLUCOSE 4 G PO CHEW
CHEWABLE_TABLET | ORAL | Status: AC
  Filled 2020-06-19: qty 1

## 2020-06-19 MED ORDER — GLYCOPYRROLATE 1 MG PO TABS
1.0000 mg | ORAL_TABLET | ORAL | Status: DC | PRN
  Filled 2020-06-19: qty 1

## 2020-06-19 MED ORDER — METRONIDAZOLE IN NACL 5-0.79 MG/ML-% IV SOLN
500.0000 mg | Freq: Three times a day (TID) | INTRAVENOUS | Status: DC
  Administered 2020-06-19: 500 mg via INTRAVENOUS
  Filled 2020-06-19: qty 100

## 2020-06-19 MED ORDER — LACTATED RINGERS IV BOLUS (SEPSIS)
1000.0000 mL | Freq: Once | INTRAVENOUS | Status: AC
  Administered 2020-06-19: 1000 mL via INTRAVENOUS

## 2020-06-19 MED ORDER — LACTATED RINGERS IV BOLUS (SEPSIS)
250.0000 mL | Freq: Once | INTRAVENOUS | Status: AC
  Administered 2020-06-19: 250 mL via INTRAVENOUS

## 2020-06-19 MED ORDER — ONDANSETRON HCL 4 MG PO TABS: 4.0000 mg | ORAL_TABLET | ORAL | Status: DC

## 2020-06-19 MED ORDER — MIDAZOLAM HCL 2 MG/2ML IJ SOLN: 1.0000 mg | Freq: Once | INTRAMUSCULAR | Status: AC

## 2020-06-19 MED ORDER — SODIUM CHLORIDE 0.9 % IV SOLN: 250.0000 mL | INTRAVENOUS | Status: DC

## 2020-06-19 MED ORDER — NOREPINEPHRINE 4 MG/250ML-% IV SOLN
INTRAVENOUS | Status: AC
  Administered 2020-06-19: 2 ug/min via INTRAVENOUS
  Filled 2020-06-19: qty 250

## 2020-06-19 MED ORDER — HYDROMORPHONE BOLUS VIA INFUSION
1.0000 mg | INTRAVENOUS | Status: DC | PRN
  Filled 2020-06-19: qty 1

## 2020-06-19 MED ORDER — MORPHINE SULFATE (PF) 2 MG/ML IV SOLN: 2.0000 mg | INTRAVENOUS | Status: DC | PRN

## 2020-06-19 MED ORDER — MORPHINE BOLUS VIA INFUSION
1.0000 mg | INTRAVENOUS | Status: DC | PRN
  Filled 2020-06-19: qty 10

## 2020-06-19 MED ORDER — DEXTROSE 50 % IV SOLN
INTRAVENOUS | Status: AC
  Administered 2020-06-19: 50 mL
  Filled 2020-06-19: qty 50

## 2020-06-19 MED ORDER — SODIUM BICARBONATE 8.4 % IV SOLN
INTRAVENOUS | Status: DC
  Filled 2020-06-19: qty 850

## 2020-06-19 MED ORDER — MORPHINE 100MG IN NS 100ML (1MG/ML) PREMIX INFUSION
0.0000 mg/h | INTRAVENOUS | Status: DC
  Administered 2020-06-19: 5 mg/h via INTRAVENOUS
  Filled 2020-06-19: qty 100

## 2020-06-20 LAB — PREPARE FRESH FROZEN PLASMA

## 2020-06-20 LAB — TYPE AND SCREEN
ABO/RH(D): O POS
Antibody Screen: NEGATIVE
Unit division: 0

## 2020-06-20 LAB — BPAM FFP
Blood Product Expiration Date: 202203022359
Blood Product Expiration Date: 202203022359
ISSUE DATE / TIME: 202202250650
ISSUE DATE / TIME: 202202250650
Unit Type and Rh: 5100
Unit Type and Rh: 5100

## 2020-06-20 LAB — BPAM RBC
Blood Product Expiration Date: 202203212359
Unit Type and Rh: 5100

## 2020-06-22 LAB — PATHOLOGIST SMEAR REVIEW

## 2020-06-23 NOTE — Progress Notes (Signed)
Pharmacy Antibiotic Note  Cindy Hess is a 65 y.o. female admitted on 05/29/2020 with intra-abdominal infection.  Pharmacy has been consulted for Cefepime dosing. WBC elevated. Scr up from baseline. Lactic acid is up this AM. Almost had a respiratory arrest this AM>>>came around with Narcan.   Plan: Cefepime 2g IV q12h Flagyl per MD Trend WBC, temp, renal function  F/U infectious work-up   Height: 5\' 1"  (154.9 cm) Weight: 39.3 kg (86 lb 10.3 oz) IBW/kg (Calculated) : 47.8  Temp (24hrs), Avg:98 F (36.7 C), Min:97.6 F (36.4 C), Max:98.5 F (36.9 C)  Recent Labs  Lab 05/26/2020 1113 06/17/20 0310 06/17/20 0315 06/17/20 1102 06/17/20 1347 06/17/2020 0036 06/05/2020 2350 07/15/2020 0125 15-Jul-2020 0351  WBC  --  23.3* 23.0* 25.5*  --  19.7* 18.4*  --   --   CREATININE  --  0.54  --   --  0.42* 0.37* 1.13*  --  1.14*  LATICACIDVEN 1.8  --   --   --  1.4  --   --  >11.0* >11.0*    Estimated Creatinine Clearance: 30.5 mL/min (A) (by C-G formula based on SCr of 1.14 mg/dL (H)).    Allergies  Allergen Reactions  . Lisinopril Cough  . Lovastatin Cough   06/21/20, PharmD, BCPS Clinical Pharmacist Phone: [phone]

## 2020-06-23 NOTE — Consult Note (Signed)
CC: unable to obtain  Requesting provider: Dr Cindy Hess  HPI: Cindy Hess is an 65 y.o. female who was admitted 2 days ago for right lower extremity weakness and pain.  Past medical history includes hypertension, previous CVA, left eye blindness, nicotine dependence, GERD, and severe PAD status post prior interventions at outside hospital.  She has a diagnosis of chronic mesenteric ischemia.  Some of her prior vascular surgeries include right femoral endarterectomy, right common iliac stenting right external iliac as well as stenting of the left iliofemoral system.  She was prescribed Plavix as an outpatient but on admission she admitted that she had not been taking her antiplatelet or antihypertensive therapy for quite some time.  She continued to smoke.  Vascular surgery has been following her for aortoiliac occlusive disease.  She had no Doppler flow in her right foot.  They were recommending revascularization.  They felt her only option was right axillobifemoral bypass.  They felt that aortobifemoral bypass grafting would result in significant bowel ischemia as she would lose perfusion to her IMA and left hypogastric artery which appears to be supplying a fair amount of her viscera.  She has a chronically occluded celiac axis and SMA.  Her perfusion in the intestine is being maintained by the IMA and left hypogastric artery.  Earlier this evening patient was found to be unresponsive and not responding to noxious stimuli.  She was found to have a low blood sugar around 51 and was given D50.  She was also given 2 rounds of Narcan and found to have some transient hypotension with a blood pressure of 77/41 and a normal saline bolus was given with improvement of blood pressure to 94/51.  She was also found to be acidotic on a blood gas.  She was found to have a low bicarbonate level.  Her hemoglobin had went down from 14-11 and she was found to have abdominal distention.  Her lactate was also  significantly elevated to above 11.  They were concerned for potential retroperitoneal bleed so a stat noncontrasted CT was ordered which revealed significant small bowel distention, air within the distal small bowel wall and air within the mesenteric vessels consistent with mesenteric ischemia  We were then called for consultation  Patient has been transferred to the ICU  She is currently conversive but somewhat somnolent.  History is unreliable.  She denies abdominal pain.  She states that September 2022 when she is in [place].  She currently has a soft blood pressure in the 80s.  She has 1 peripheral IV.  Heparin drip was restarted once she was found to have mesenteric ischemia on CT  I had ordered an INR which came back 2.6    Past Medical History:  Diagnosis Date  . Blind left eye   . Coronary artery disease   . GERD (gastroesophageal reflux disease)   . Hyperlipidemia   . Hypertension   . PAD (peripheral artery disease) (HCC)   . Stroke [facility]) 1997    Past Surgical History:  Procedure Laterality Date  . CORONARY ANGIOPLASTY WITH STENT PLACEMENT     3 stent    Family History  Problem Relation Age of Onset  . Hypertension Mother     Social:  reports that she has been smoking cigarettes. She has a 0.75 pack-year smoking history. She has never used smokeless tobacco. She reports current alcohol use. She reports previous drug use.  Allergies:  Allergies  Allergen Reactions  . Lisinopril Cough  . Lovastatin Cough  Medications: I have reviewed the patient's current medications.   ROS -unable to obtain, somnolent  PE Blood pressure 106/71, pulse (!) 59, temperature 97.6 F (36.4 C), temperature source Oral, resp. rate (!) 22, height 5\' 1"  (1.549 m), weight 39.3 kg, SpO2 93 %. rectal temp 89 Constitutional: appears somewhat chronically ill, somnolent, appears underweight; no deformities Eyes: Moist conjunctiva; no lid lag; anicteric; PERRL Neck: Trachea  midline; no thyromegaly Lungs: Normal respiratory effort; no tactile fremitus CV: RRR; no palpable thrills; no pitting edema GI: Abd distended, soft, not firm, does not grimace/wince on palpation; no palpable hepatosplenomegaly MSK: free range of motion, no deformities; no clubbing/cyanosis Psychiatric: Appropriate affect; insight appears impaired/limited in current condition, somnolent but does answer questions, oriented x2 (September 2022, Cindy Hess, Cindy Hess) Lymphatic: No palpable cervical or axillary lymphadenopathy Skin:no rash, lesions, dry scaly feet  Results for orders placed or performed during the hospital encounter of 06/20/2020 (from the past 48 hour(s))  CBC     Status: Abnormal   Collection Time: 06/17/20 11:02 AM  Result Value Ref Range   WBC 25.5 (H) 4.0 - 10.5 K/uL   RBC 5.11 3.87 - 5.11 MIL/uL   Hemoglobin 14.6 12.0 - 15.0 g/dL   HCT 06/13/2020 16.1 - 09.6 %   MCV 85.1 80.0 - 100.0 fL   MCH 28.6 26.0 - 34.0 pg   MCHC 33.6 30.0 - 36.0 g/dL   RDW 04.5 40.9 - 81.1 %   Platelets 340 150 - 400 K/uL   nRBC 0.0 0.0 - 0.2 %    Comment: Performed at [facility], [address], [place], [place]  ABO/Rh     Status: None   Collection Time: 06/17/20 11:02 AM  Result Value Ref Range   ABO/RH(D)      O POS Performed at [facility], [address] [place], [place]   Lactic acid, plasma     Status: None   Collection Time: 06/17/20  1:47 PM  Result Value Ref Range   Lactic Acid, Venous 1.4 0.5 - 1.9 mmol/L    Comment: Performed at [facility], [address] [place], [place]  Comprehensive metabolic panel     Status: Abnormal   Collection Time: 06/17/20  1:47 PM  Result Value Ref Range   Sodium 138 135 - 145 mmol/L   Potassium 3.7 3.5 - 5.1 mmol/L   Chloride 101 98 - 111 mmol/L   CO2 24 22 - 32 mmol/L   Glucose, Bld 123 (H) 70 - 99 mg/dL    Comment: Glucose reference range applies only to samples taken after fasting for at least  8 hours.   BUN 11 8 - 23 mg/dL   Creatinine, Ser 05/31/2020 (L) 0.44 - 1.00 mg/dL   Calcium 9.2 8.9 - 4.69 mg/dL   Total Protein 7.3 6.5 - 8.1 g/dL   Albumin 2.7 (L) 3.5 - 5.0 g/dL   AST 19 15 - 41 U/L   ALT 13 0 - 44 U/L   Alkaline Phosphatase 93 38 - 126 U/L   Total Bilirubin 0.8 0.3 - 1.2 mg/dL   GFR, Estimated 62.9 >52 mL/min    Comment: (NOTE) Calculated using the CKD-EPI Creatinine Equation (2021)    Anion gap 13 5 - 15    Comment: Performed at [facility], [address], [place], [place]  Troponin I (High Sensitivity)     Status: Abnormal   Collection Time: 06/17/20  9:58  PM  Result Value Ref Range   Troponin I (High Sensitivity) 19 (H) <18 ng/L    Comment: (NOTE) Elevated high sensitivity troponin I (hsTnI) values and significant  changes across serial measurements may suggest ACS but many other  chronic and acute conditions are known to elevate hsTnI results.  Refer to the "Links" section for chest pain algorithms and additional  guidance. Performed at [facility], [address] [place], [place] [address]   MRSA PCR Screening     Status: None   Collection Time: 06/17/20 11:35 PM   Specimen: Nasal Mucosa; Nasopharyngeal  Result Value Ref Range   MRSA by PCR NEGATIVE NEGATIVE    Comment:        The GeneXpert MRSA Assay (FDA approved for NASAL specimens only), is one component of a comprehensive MRSA colonization surveillance program. It is not intended to diagnose MRSA infection nor to guide or monitor treatment for MRSA infections. Performed at [facility], [address] [place], [place] [address]   Heparin level (unfractionated)     Status: None   Collection Time: 2020-06-22 12:36 AM  Result Value Ref Range   Heparin Unfractionated 0.44 0.30 - 0.70 IU/mL    Comment: (NOTE) If heparin results are below expected values, and patient dosage has  been confirmed, suggest follow up testing of antithrombin III levels. Performed at [facility], [address] [place], [place] [address]   CBC     Status: Abnormal   Collection Time: 06/22/2020 12:36 AM  Result Value Ref Range   WBC 19.7 (H) 4.0 - 10.5 K/uL   RBC 4.47 3.87 - 5.11 MIL/uL   Hemoglobin 12.9 12.0 - 15.0 g/dL   HCT 35.5 73.2 - 20.2 %   MCV 84.8 80.0 - 100.0 fL   MCH 28.9 26.0 - 34.0 pg   MCHC 34.0 30.0 - 36.0 g/dL   RDW 54.2 70.6 - 23.7 %   Platelets 297 150 - 400 K/uL   nRBC 0.0 0.0 - 0.2 %    Comment: Performed at [facility], [address] [place], [place] [address]  Basic metabolic panel     Status: Abnormal   Collection Time: Jun 22, 2020 12:36 AM  Result Value Ref Range   Sodium 135 135 - 145 mmol/L   Potassium 3.4 (L) 3.5 - 5.1 mmol/L   Chloride 103 98 - 111 mmol/L   CO2 22 22 - 32 mmol/L   Glucose, Bld 108 (H) 70 - 99 mg/dL    Comment: Glucose reference range applies only to samples taken after fasting for at least 8 hours.   BUN 9 8 - 23 mg/dL   Creatinine, Ser 5.17 (L) 0.44 - 1.00 mg/dL   Calcium 8.6 (L) 8.9 - 10.3 mg/dL   GFR, Estimated >61 >60 mL/min    Comment: (NOTE) Calculated using the CKD-EPI Creatinine Equation (2021)    Anion gap 10 5 - 15    Comment: Performed at [facility], [address], [place], [place] [address]  Troponin I (High Sensitivity)     Status: Abnormal   Collection Time: 22-Jun-2020 12:36 AM  Result Value Ref Range   Troponin I (High Sensitivity) 22 (H) <18 ng/L    Comment: (NOTE) Elevated high sensitivity troponin I (hsTnI) values and significant  changes across serial measurements may suggest ACS but many other  chronic and acute conditions are known to elevate hsTnI results.  Refer to the "Links" section for  chest pain algorithms and additional  guidance. Performed at [facility], [address], [place], [place] [address]   Magnesium     Status: None   Collection Time: 06/01/2020 12:36 AM  Result Value Ref Range   Magnesium 1.8 1.7 - 2.4 mg/dL    Comment: Performed at [facility], [address] [place], [place] [address]  Glucose, capillary     Status: Abnormal   Collection Time: 05/31/2020 10:48 PM  Result Value Ref Range   Glucose-Capillary 51 (L) 70 - 99 mg/dL    Comment: Glucose reference range applies only to samples taken after fasting for at least 8 hours.  Blood gas, arterial     Status: Abnormal   Collection Time: 06/20/2020 11:07 PM  Result Value Ref Range   FIO2 100.00    pH, Arterial 7.275 (L) 7.350 - 7.450   pCO2 arterial 22.0 (L) 32.0 - 48.0 mmHg   pO2, Arterial 344 (H) 83.0 - 108.0 mmHg   Bicarbonate 9.9 (L) 20.0 - 28.0 mmol/L   Acid-base deficit 15.7 (H) 0.0 - 2.0 mmol/L   O2 Saturation 99.0 %   Patient temperature 36.9    Collection site LEFT RADIAL    Drawn by [id number]     Comment: COLLECTED BY RT   Sample type ARTERIAL DRAW    Allens test (pass/fail) PASS PASS    Comment: Performed at [facility], [address], [place], [place] [address]  Glucose, capillary     Status: Abnormal   Collection Time: 06/14/2020 11:37 PM  Result Value Ref Range   Glucose-Capillary 234 (H) 70 - 99 mg/dL    Comment: Glucose reference range applies only to samples taken after fasting for at least 8 hours.  Heparin level (unfractionated)     Status: None   Collection Time: 06/22/2020 11:50 PM  Result Value Ref Range   Heparin Unfractionated 0.47 0.30 - 0.70 IU/mL    Comment: (NOTE) If heparin results are below expected values, and patient dosage has  been confirmed, suggest follow up testing of antithrombin III levels. Performed at [facility], [address], [place], [place] [address]   Basic metabolic panel     Status: Abnormal   Collection Time: 06/07/2020 11:50 PM  Result Value Ref Range   Sodium 135 135 - 145 mmol/L   Potassium 4.6 3.5 - 5.1 mmol/L   Chloride 105 98 - 111 mmol/L   CO2 10 (L) 22 - 32 mmol/L   Glucose, Bld 330 (H) 70 - 99 mg/dL    Comment: Glucose reference range applies only to samples taken after fasting for at least  8 hours.   BUN 20 8 - 23 mg/dL   Creatinine, Ser 9.62 (H) 0.44 - 1.00 mg/dL   Calcium 8.7 (L) 8.9 - 10.3 mg/dL   GFR, Estimated 54 (L) >60 mL/min    Comment: (NOTE) Calculated using the CKD-EPI Creatinine Equation (2021)    Anion gap 20 (H) 5 - 15    Comment: Performed at [facility], [address] [place], [place] [address]  CBC     Status: Abnormal   Collection Time: 06/07/2020 11:50 PM  Result Value Ref Range   WBC 18.4 (H) 4.0 - 10.5 K/uL   RBC 4.05 3.87 - 5.11 MIL/uL   Hemoglobin 11.8 (L) 12.0 - 15.0 g/dL   HCT 13.2 (L) 44.0 - 10.2 %   MCV 88.4 80.0 - 100.0 fL   MCH  29.1 26.0 - 34.0 pg   MCHC 33.0 30.0 - 36.0 g/dL   RDW 16.1 09.6 - 04.5 %   Platelets 232 150 - 400 K/uL   nRBC 0.4 (H) 0.0 - 0.2 %    Comment: Performed at [facility], [address] [place], [place] [address]  Lactic acid, plasma     Status: Abnormal   Collection Time: 05/27/2020  1:25 AM  Result Value Ref Range   Lactic Acid, Venous >11.0 (HH) 0.5 - 1.9 mmol/L    Comment: CRITICAL RESULT CALLED TO, READ BACK BY AND VERIFIED WITH: Earleen Reaper N,RN 05/26/2020 0244 WAYK Performed at [facility], [address] [place], [place] [address]   Blood gas, arterial     Status: Abnormal   Collection Time: 05/27/2020  1:36 AM  Result Value Ref Range   FIO2 100.00    pH, Arterial 7.185 (LL) 7.350 - 7.450    Comment: CRITICAL RESULT CALLED TO, READ BACK BY AND VERIFIED WITH: K.DANIEL RRT 0145 05/27/2020 MCCORMICK K     pCO2 arterial <19.0 (LL) 32.0 - 48.0 mmHg    Comment: CRITICAL RESULT CALLED TO, READ BACK BY AND VERIFIED WITH: K.DANIEL RRT 0145 06/07/2020 MCCORMICK K     pO2, Arterial 429 (H) 83.0 - 108.0 mmHg   Bicarbonate 6.9 (L) 20.0 - 28.0 mmol/L   Acid-base deficit 20.0 (H) 0.0 - 2.0 mmol/L   O2 Saturation 98.9 %   Patient temperature 37.0    Collection site RIGHT RADIAL    Drawn by [phone]     Comment: COLLECTED BY RT   Sample type ARTERIAL DRAW    Allens test (pass/fail) PASS PASS     Comment: Performed at [facility], [address] [place], [place] [address]  Prepare RBC (crossmatch)     Status: None   Collection Time: 06/06/2020  2:57 AM  Result Value Ref Range   Order Confirmation      ORDER PROCESSED BY BLOOD BANK Performed at [facility], [address], [place], [place] [address]   Protime-INR     Status: Abnormal   Collection Time: 06/08/2020  3:24 AM  Result Value Ref Range   Prothrombin Time 26.7 (H) 11.4 - 15.2 seconds   INR 2.6 (H) 0.8 - 1.2    Comment: (NOTE) INR goal varies based on device and disease states. Performed at [facility], [address] [place], [place] [address]   Glucose, capillary     Status: Abnormal   Collection Time: 06/09/2020  3:43 AM  Result Value Ref Range   Glucose-Capillary 24 (LL) 70 - 99 mg/dL    Comment: Glucose reference range applies only to samples taken after fasting for at least 8 hours.   Comment 1 Notify RN   Lactic acid, plasma     Status: Abnormal   Collection Time: 06/04/2020  3:51 AM  Result Value Ref Range   Lactic Acid, Venous >11.0 (HH) 0.5 - 1.9 mmol/L    Comment: CRITICAL VALUE NOTED.  VALUE IS CONSISTENT WITH PREVIOUSLY REPORTED AND CALLED VALUE. Performed at [facility], [address] [place], [place] [address]   Procalcitonin - Baseline     Status: None   Collection Time: 05/26/2020  3:51 AM  Result Value Ref Range   Procalcitonin 6.87 ng/mL    Comment:        Interpretation: PCT > 2 ng/mL: Systemic infection (sepsis) is likely, unless other causes are known. (  NOTE)       Sepsis PCT Algorithm           Lower Respiratory Tract                                      Infection PCT Algorithm    ----------------------------     ----------------------------         PCT < 0.25 ng/mL                PCT < 0.10 ng/mL          Strongly encourage             Strongly discourage   discontinuation of antibiotics    initiation of antibiotics    ----------------------------      -----------------------------       PCT 0.25 - 0.50 ng/mL            PCT 0.10 - 0.25 ng/mL               OR       >80% decrease in PCT            Discourage initiation of                                            antibiotics      Encourage discontinuation           of antibiotics    ----------------------------     -----------------------------         PCT >= 0.50 ng/mL              PCT 0.26 - 0.50 ng/mL               AND       <80% decrease in PCT              Encourage initiation of                                             antibiotics       Encourage continuation           of antibiotics    ----------------------------     -----------------------------        PCT >= 0.50 ng/mL                  PCT > 0.50 ng/mL               AND         increase in PCT                  Strongly encourage                                      initiation of antibiotics    Strongly encourage escalation           of antibiotics                                     -----------------------------  PCT <= 0.25 ng/mL                                                 OR                                        > 80% decrease in PCT                                      Discontinue / Do not initiate                                             antibiotics  Performed at [facility], [address], [place], [place] [address]   Basic metabolic panel     Status: Abnormal   Collection Time: 05/30/2020  3:51 AM  Result Value Ref Range   Sodium 142 135 - 145 mmol/L   Potassium 4.5 3.5 - 5.1 mmol/L   Chloride 110 98 - 111 mmol/L   CO2 9 (L) 22 - 32 mmol/L   Glucose, Bld 34 (LL) 70 - 99 mg/dL    Comment: Glucose reference range applies only to samples taken after fasting for at least 8 hours. CRITICAL RESULT CALLED TO, READ BACK BY AND VERIFIED WITH: CAMPBELL S,RN 05/30/2020 0437 WAYK    BUN 21 8 - 23 mg/dL   Creatinine, Ser 8.85 (H) 0.44 - 1.00 mg/dL   Calcium 8.8  (L) 8.9 - 10.3 mg/dL   GFR, Estimated 53 (L) >60 mL/min    Comment: (NOTE) Calculated using the CKD-EPI Creatinine Equation (2021)    Anion gap 23 (H) 5 - 15    Comment: Performed at [facility], [address], [place], [place] [address]  Glucose, capillary     Status: Abnormal   Collection Time: 06/12/2020  4:05 AM  Result Value Ref Range   Glucose-Capillary 358 (H) 70 - 99 mg/dL    Comment: Glucose reference range applies only to samples taken after fasting for at least 8 hours.  Glucose, capillary     Status: Abnormal   Collection Time: 06/05/2020  5:21 AM  Result Value Ref Range   Glucose-Capillary 274 (H) 70 - 99 mg/dL    Comment: Glucose reference range applies only to samples taken after fasting for at least 8 hours.    CT ABDOMEN PELVIS WO CONTRAST  Addendum Date: 06/22/2020   ADDENDUM REPORT: 06/12/2020 03:31 ADDENDUM: Results were discussed with Dr. Rachael Hess at 3:29 a.m. [place] on June 19, 2020. Electronically Signed   By: Aram Candela M.D.   On: 06/15/2020 03:31   Result Date: 06/05/2020 CLINICAL DATA:  Rule out retroperitoneal bleeding. EXAM: CT ABDOMEN AND PELVIS WITHOUT CONTRAST TECHNIQUE: Multidetector CT imaging of the abdomen and pelvis was performed following the standard protocol without IV contrast. COMPARISON:  August 16, 2018 FINDINGS: Lower chest: Mild areas of patchy infiltrate are seen within the posteromedial aspect of the right lower lobe. Mild posterior left basilar scarring and/or atelectasis is also noted. Hepatobiliary: A small amount of air is  seen within the anterior aspect of the right lobe of the liver. Contrast attenuation is seen throughout the lumen of the gallbladder. Pancreas: Unremarkable. No pancreatic ductal dilatation or surrounding inflammatory changes. Spleen: Normal in size without focal abnormality. Adrenals/Urinary Tract: Adrenal glands are unremarkable. Kidneys are normal, without renal calculi, focal lesion, or hydronephrosis. The  urinary bladder is poorly visualized. Stomach/Bowel: There is moderate severity gastric distension. The appendix is not visualized. Multiple dilated small bowel loops are seen throughout the abdomen and pelvis (maximum small bowel diameter of approximately 4.2 cm) air is also seen within the wall of multiple distal small bowel loops. Vascular/Lymphatic: Marked severity aortic atherosclerosis. Bilateral external iliac artery stents are in place. No enlarged abdominal or pelvic lymph nodes. Reproductive: Uterus and bilateral adnexa are unremarkable. Other: No abdominal wall hernia or abnormality. Air is seen within multiple branching mesenteric vessels (axial CT images 41 through 65, CT series number 3). No abdominopelvic ascites. Musculoskeletal: No acute or significant osseous findings. IMPRESSION: 1. High-grade distal small bowel obstruction. 2. Portal venous air within the right lobe of the liver with a moderate amount of air noted throughout the mesenteric vessels, consistent with sequelae associated with mesenteric ischemia. 3. Mild right lower lobe infiltrate. 4. Bilateral external iliac artery stents. 5. Aortic atherosclerosis. Aortic Atherosclerosis (ICD10-I70.0). Electronically Signed: By: Aram Candelahaddeus  Houston M.D. On: 06/03/2020 03:27   DG Chest 1 View  Result Date: 06/14/2020 CLINICAL DATA:  Shortness of breath and weakness. EXAM: CHEST  1 VIEW COMPARISON:  June 17, 2020 FINDINGS: The lungs are hyperinflated. There is no evidence of acute infiltrate, pleural effusion or pneumothorax. The heart size and mediastinal contours are within normal limits. The visualized skeletal structures are unremarkable. IMPRESSION: No active disease. Electronically Signed   By: Aram Candelahaddeus  Houston M.D.   On: 06/17/2020 01:18   DG Chest 1 View  Result Date: 06/17/2020 CLINICAL DATA:  Chest pain, shortness of breath, elevated blood pressure EXAM: CHEST  1 VIEW COMPARISON:  Radiograph 06/10/2020 FINDINGS: Mild  hyperinflation similar to comparison exam. No consolidation, features of edema, pneumothorax, or effusion. Pulmonary vascularity is normally distributed. The cardiomediastinal contours are unremarkable. No acute osseous or soft tissue abnormality. The cardiomediastinal contours are unremarkable. No acute osseous or soft tissue abnormality. Degenerative changes are present in the imaged spine and shoulders. Telemetry leads overlie the chest. IMPRESSION: No acute cardiopulmonary disease. Chronic hyperinflation. Electronically Signed   By: Kreg ShropshirePrice  DeHay M.D.   On: 06/17/2020 22:17    Imaging: Personally reviewed  A/P: Cindy Hess is an 65 y.o. female with PAD, aortoiliac occlusive disease, chronic mesenteric ischemia, poorly controlled hypertension, acute kidney injury, tobacco dependence, leukocytosis, elevated lactate, abdominal distention, hypothermia, sepsis, acute on chronic mesenteric ischemia  She appears to have acute on chronic mesenteric ischemia.  There is evidence of pneumatosis on CT while this is not a hard indicator for urgent laparotomy; this in conjunction with her change in lab parameters and vitals is all very concerning for acute ischemia-elevated lactate, hypotension, coagulopathy, hypothermia with sepsis  Place nasogastric tube Placed central line Place A-line Stop IV heparin Transfuse 2 units FFP given INR 2.6 Warm patient Start IV antibiotic for intestinal coverage  Discussed with vascular surgery who will come see  I believe patient needs abdominal exploration given the CT findings and her worsening lactate.  However patient is very high risk for complications as well as perioperative mortality.  Her national surgical quality improvement risk calculator outcomes are at the bottom.  I discussed laparotomy with  the patient including bleeding, infection, need for additional procedures, discontinuity, potential ostomy, potential leak, hernia formation, cardiac issues such  as heart attacks, renal failure, death as well as probable discharge to nursing home should she survive this hospitalization  I am not sure if the patient is consentable given her orientation concerns.  I will attempt to reach next of kin  In the interim start resuscitation, start IV antibiotics, reverse coagulopathy with FFP, warm patient  Discussed with CCM as well    Mary Sella. Andrey Campanile, MD, FACS General, Bariatric, & Minimally Invasive Surgery [facility], [place]

## 2020-06-23 NOTE — Progress Notes (Addendum)
eLink Physician-Brief Progress Note Patient Name: Derika Eckles DOB: 1956/02/12 MRN: [id number]   Date of Service  06/10/2020  HPI/Events of Note  Patient transferred from the floor to 2 H 18 after  a couple of rapid response calls and a "code blue" call. Patient had air in the portal system and small bowel obstruction on abdominal CT imaging, consistent with an acute surgical abdomen, the surgeon is in the room evaluating the patient, who has been hypotensive and hypoglycemic. Patient is however following commands, and has a history of severe peripheral vascular disease.  eICU Interventions  Peripheral Levophed ordered, arterial line ordered, PCCM ground crew requested to come and place a central line for fluid resuscitation and pressor access. D 5 bicarbonate infusion ordered for acidosis and hypoglycemia. New Patient Evaluation completed.        Migdalia Dk 05/27/2020, 5:38 AM

## 2020-06-23 NOTE — Sepsis Progress Note (Signed)
Spoke with bedside nurse regarding need for repeat LA once the LR bolus is completed.

## 2020-06-23 NOTE — Consult Note (Signed)
NAME:  Cindy Hess, MRN:  [id number], DOB:  Nov 06, 1955, LOS: 2 ADMISSION DATE:  06/01/2020, CONSULTATION DATE:  06/15/2020 REFERRING MD:   Elige Radon Chotiner CHIEF COMPLAINT:  Hypotension/Unreponsive  Brief History:  Cindy Hess is a 65 year old woman, daily smoker with hypertension and severe peripheral artery disease, CAD s/p stenting and hyperlipidemia who presented on 2/23 with severe pain and weakness in both of her legs. 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  On 2/24 in the evening she had a rapid response called as she was found unresponsive. This was thought to be secondary to narcotic pain medication as she initially responded to narcan. She then had a second unresponsive episode which again responded to narcan and fluid administration. Labs revealed a serum bicarb of 10 and an initial blood gas with pH 7.2, pCO 22, pO2 344, HCO3 9.9 and a follow up gas with pH 7.18, pCO2 <19, pO2 429 and HCO3 6.9. Lactic acid returned at >11. Hemoglobin had trended down to 11g/dL from 14 on admission. CT abdomen pelvis was done to look for retroperitoneal bleed but discovered air throughout the mesenteric vessels consistent with mesenteric ischemia.   Based on concerning trajectory of patient she was transferred to the ICU for further care on 2/25.   Past Medical History:  CAD PVD Tobacco Use HTN HLD  Significant Hospital Events:  2/24 - rapid response  Consults:  Interventional Radiology Vascular Surgery  Procedures:    Significant Diagnostic Tests:  CT abdomen 2/25 1. High-grade distal small bowel obstruction. 2. Portal venous air within the right lobe of the liver with a moderate amount of air noted throughout the mesenteric vessels, consistent with sequelae associated with  mesenteric ischemia. 3. Mild right lower lobe infiltrate. 4. Bilateral external iliac artery stents. 5. Aortic atherosclerosis.  Micro Data:    Antimicrobials:    Interim History / Subjective:    Objective   Blood pressure 122/72, pulse 65, temperature 97.6 F (36.4 C), temperature source Oral, resp. rate (!) 32, height 5\' 1"  (1.549 m), weight 39.3 kg, SpO2 91 %.        Intake/Output Summary (Last 24 hours) at 06/06/2020 06/21/2020 Last data filed at 2020-07-18 0700 Gross per 24 hour  Intake 32 ml  Output 600 ml  Net -568 ml   Filed Weights   06/13/2020 1108 06/17/20 1220  Weight: 45.4 kg 39.3 kg    Examination: General: moderate distress, chronically ill appearing woman HENT: [place]/AT, sclera anicteric, moist mucous membranes Lungs: clear to auscultation bilaterally Cardiovascular: RRR, s1s2, no murmurs Abdomen: soft, tenderness present, non-distended Extremities: warm, no edema Neuro: alert, oriented, moving all extremities GU: no foley  Resolved Hospital Problem list     Assessment & Plan:   Mesenteric Ischemia Small Bowel Obstruction - Will have NG tube placed, low intermittent suction - IR initially called and recommended Surgery/Vascular Surgery involvement - On heparin gtt  Acute Kidney Injury Severe Metabolic Acidosis Lactic Acidosis In setting of mesenteric ischemia - given 1 amp of bicarb and starting bicarb drip - Fluid boluses as needed  Peripheral Vascular Disease Occlusion of celiac trunk, 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, occlusion of common iliac artery and indwelling stent from common to external iliac artery - On heparin Drip - Vascular surgery following  Mixed hyperlipidemia - LDL 100,HDL 40 -Continue Lipitor    GERD without esophagitis -continue protonix  Best practice (evaluated daily)  Diet: NPO Pain/Anxiety/Delirium protocol (if indicated): IV morphine VAP protocol (if indicated): n/a DVT prophylaxis: heparin  gtt GI prophylaxis: PPI Glucose control: n/a Mobility: bed rest Disposition: ICU  Goals of Care:  Last date of multidisciplinary goals of care discussion: Family and staff present:  Summary of discussion:  Follow up goals of care discussion due:  Code Status: full  Labs   CBC: Recent Labs  Lab 05/29/2020 1112 06/17/20 0310 06/17/20 0315 06/17/20 1102 06/12/2020 0036 05/31/2020 2350  WBC 16.0* 23.3* 23.0* 25.5* 19.7* 18.4*  NEUTROABS 13.8* 19.7*  --   --   --   --   HGB 14.9 14.8 14.8 14.6 12.9 11.8*  HCT 44.6 44.4 45.2 43.5 37.9 35.8*  MCV 85.9 87.2 86.4 85.1 84.8 88.4  PLT 334 344 342 340 297 232    Basic Metabolic Panel: Recent Labs  Lab 06/15/2020 1112 06/17/20 0310 06/17/20 1347 05/30/2020 0036 06/14/2020 2350  NA 139 140 138 135 135  K 3.6 3.3* 3.7 3.4* 4.6  CL 98 102 101 103 105  CO2 26 22 24 22  10*  GLUCOSE 126* 140* 123* 108* 330*  BUN 13 13 11 9 20   CREATININE 0.58 0.54 0.42* 0.37* 1.13*  CALCIUM 9.5 9.5 9.2 8.6* 8.7*  MG  --  2.0  --  1.8  --    GFR: Estimated Creatinine Clearance: 30.8 mL/min (A) (by C-G formula based on SCr of 1.13 mg/dL (H)). Recent Labs  Lab 05/28/2020 1113 06/17/20 0310 06/17/20 0315 06/17/20 1102 06/17/20 1347 06/21/2020 0036 06/17/2020 2350 06/23/20 0125  WBC  --    < > 23.0* 25.5*  --  19.7* 18.4*  --   LATICACIDVEN 1.8  --   --   --  1.4  --   --  >11.0*   < > = values in this interval not displayed.    Liver Function Tests: Recent Labs  Lab 06/14/2020 1112 06/17/20 0310 06/17/20 1347  AST 19 22 19   ALT 16 17 13   ALKPHOS 98 94 93  BILITOT 1.0 0.9 0.8  PROT 8.3* 7.5 7.3  ALBUMIN 3.5 3.0* 2.7*   No results for input(s): LIPASE, AMYLASE in the last 168 hours. No results for input(s): AMMONIA in the last 168 hours.  ABG    Component Value Date/Time   PHART 7.185 (LL) 23-Jun-2020 0136   PCO2ART <19.0 (LL) Jun 23, 2020 0136   PO2ART 429 (H) 23-Jun-2020 0136   HCO3 6.9 (L) Jun 23, 2020 0136   ACIDBASEDEF 20.0 (H) 06-23-20  0136   O2SAT 98.9 Jun 23, 2020 0136     Coagulation Profile: Recent Labs  Lab 06/01/2020 1112  INR 1.3*    Cardiac Enzymes: No results for input(s): CKTOTAL, CKMB, CKMBINDEX, TROPONINI in the last 168 hours.  HbA1C: Hgb A1c MFr Bld  Date/Time Value Ref Range Status  06/17/2020 03:10 AM 6.0 (H) 4.8 - 5.6 % Final    Comment:    (NOTE) Pre diabetes:          5.7%-6.4%  Diabetes:              >6.4%  Glycemic control for   <7.0% adults with diabetes   12/20/2017 12:07 PM 5.8 (H) 4.8 - 5.6 % Final    Comment:             Prediabetes: 5.7 - 6.4  Diabetes: >6.4          Glycemic control for adults with diabetes: <7.0     CBG: Recent Labs  Lab 05/30/2020 1114 July 14, 2020 2248 07-14-20 2337  GLUCAP 126* 51* 234*    Review of Systems:   A 10 point review of systems was performed and is negative unless stated above in HPI  Past Medical History:  She,  has a past medical history of Blind left eye, Coronary artery disease, GERD (gastroesophageal reflux disease), Hyperlipidemia, Hypertension, PAD (peripheral artery disease) (HCC), and Stroke (HCC) (1997).   Surgical History:   Past Surgical History:  Procedure Laterality Date  . CORONARY ANGIOPLASTY WITH STENT PLACEMENT     3 stent     Social History:   reports that she has been smoking cigarettes. She has a 0.75 pack-year smoking history. She has never used smokeless tobacco. She reports current alcohol use. She reports previous drug use.   Family History:  Her family history includes Hypertension in her mother.   Allergies Allergies  Allergen Reactions  . Lisinopril Cough  . Lovastatin Cough     Home Medications  Prior to Admission medications   Medication Sig Start Date End Date Taking? Authorizing Provider  NEURONTIN 100 MG capsule Take 100 mg by mouth 3 (three) times daily. 02/10/20  Yes [provider]  amLODipine (NORVASC) 10 MG tablet Take 1 tablet by mouth once daily Patient not taking:  Reported on 05/29/2020 07/25/18   Claiborne Rigg, NP  atorvastatin (LIPITOR) 40 MG tablet Take 1 tablet (40 mg total) by mouth daily. Patient not taking: Reported on 05/31/2020 12/20/17 03/20/18  Claiborne Rigg, NP  clopidogrel (PLAVIX) 75 MG tablet Take 1 tablet (75 mg total) by mouth daily. Patient not taking: Reported on 06/15/2020 12/20/17   Claiborne Rigg, NP  losartan (COZAAR) 100 MG tablet Take 1 tablet (100 mg total) by mouth daily. Patient not taking: Reported on 05/30/2020 12/20/17 03/20/18  Claiborne Rigg, NP  metoprolol succinate (TOPROL-XL) 50 MG 24 hr tablet Take 1 tablet (50 mg total) by mouth daily. Take with or immediately following a meal. Patient not taking: Reported on 06/13/2020 02/14/18 05/15/18  Claiborne Rigg, NP  ondansetron (ZOFRAN) 4 MG tablet Take 1 tablet (4 mg total) by mouth every 8 (eight) hours as needed for nausea or vomiting. Patient not taking: Reported on 06/22/2020 08/16/18   Mesner, Barbara Cower, MD  pantoprazole (PROTONIX) 20 MG tablet Take 1 tablet (20 mg total) by mouth daily. Patient not taking: Reported on 06/08/2020 12/20/17 03/20/18  Claiborne Rigg, NP     Critical care time: 52 minutes     Melody Comas, MD [facility] Office: [phone]   See Amion for Pager Details

## 2020-06-23 NOTE — Progress Notes (Signed)
Called to bedside to evaluate patient. She has deteriorated overnight. Planned for ax-bi-fem bypass today for indirect revascularization to the bowel.  She is non responsive. On my exam, she is groaning but cannot provide any history.  Her abdomen is quite distended. No signs of peritonitis.  She is hypothermic (86 by rectal thermometer), acidotic (pH 7.18), coagulopathic (INR 2.6), hypoglycemic (BG 34). She has a leukocytosis to 19.7. Her CT scan shows portal venous gas.   Her most recent CT has no contrast so we cannot assess her mesenteric flow. Her prior CTA shows flush occlusions of the celiac axis and superior mesenteric artery. Her only source of flow to her intestines via a named vessel is through the IMA, which appears diminutive.   I do not have a good option to directly restore flow to her bowel. I think her chance of dying from this approaches 100%. Should she be resuscitated to the point we can perform an exploratory laparotomy, I will be available to join the general surgeons at any point today.  Rande Brunt. Lenell Antu, MD [facility] Phone Number: [phone] July 06, 2020 7:15 AM

## 2020-06-23 NOTE — Progress Notes (Signed)
   06/14/2020 0250  Gastrointestinal  Gastrointestinal (WDL) X  Abdomen Inspection Taut (Firm)  Urine Characteristics  Bladder Scan Volume (mL) 37 mL   MD notified that pt has not urinated after 2L Bolus. Bladder scan showed 37. Abdomen does feel firm in comparison to beginning of shift. Awaiting CT and results.

## 2020-06-23 NOTE — Progress Notes (Signed)
  Interdisciplinary Goals of Care Family Meeting   Date carried out:: 06/03/2020  Location of the meeting: Bedside  Member's involved: Patient's brother Cindy Hess and his wife, physician Dr. Tonia Brooms, nurse practitioner Raymon Mutton, and bedside nurse   Durable Power of Attorney or acting medical decision maker: Shared by family members  Discussion: We discussed goals of care for Cindy Hess .  [name]'s care team discussed patient's current medical conditions including sepsis, acute mesenteric ischemia, severe lactic acidosis, acute kidney injury, shock liver, coagulopathy, and longstanding peripheral vascular disease and expected trajectory.  Multiple consulting physicians including general surgery, vascular surgery, cardiology and critical care have evaluated [name] and unfortunately continued aggressive care is unlikely to provide additional benefit.  The vascular issues including acute mesenteric ischemia are unlikely to be corrected vascularly.  Family endorsed understanding and decision was made to pursue comfort measures.  Orders placed for comfort measures including morphine drip.  Code status: Full DNR  Disposition: In-patient comfort care  Time spent for the meeting: 34 min  [name] 06/09/2020, 10:50 AM

## 2020-06-23 NOTE — Progress Notes (Signed)
Hypoglycemic Event  CBG: 24  Treatment: D50 50 mL (25 gm)  Symptoms: Sweaty  Follow-up CBG: Time: 358 CBG Result: 0405  Possible Reasons for Event: Unknown  Comments/MD notified: Chot[place], MD notified via phone call at 0354.    Marigene Ehlers

## 2020-06-23 NOTE — Progress Notes (Signed)
RT NOTE:  Sample collected and labeled.  Lab notified of STAT ABG

## 2020-06-23 NOTE — Progress Notes (Signed)
Entered pt's room after just leaving and pt was found to be unresponsive and was not responding to noxious stimuli. Pt appeared to be grunting with respirations so pt was suctioned with no improvement. Rapid response RN Onalee Hua notified. MD then notified.   CBG then checked, noted to be 51, D50 then given, CBG improved.  Narcan 0.4 given x2. Pt mentation improved.  BP 77/41 so NS bolus given, BP improved to 94/41.   Team decided to leave her on the floor at this time.    At approximately 0039 pt became hypotensive and her RR was 35, MD paged. Bolus ordered and initiated, CXR, repeat ABGs. Then pt became  unresponsive again, RR RN Onalee Hua called. One more dose of narcan given. Pt now alert and attempting to take off oxygen and pulling at lines. Charge RN Joni Reining aware. Nurse tech Pam sitting at bedside with pt. Continuing to monitor.

## 2020-06-23 NOTE — Consult Note (Signed)
Consultation Note Date: Jun 26, 2020   Patient Name: Cindy Hess  DOB: 1955-10-03  MRN: [id number]  Age / Sex: 65 y.o., female  PCP: Patient, No Pcp Per Referring Physician: Little Ishikawa, MD  Reason for Consultation: Non pain symptom management, Pain control and Psychosocial/spiritual support  HPI/Patient Profile: 65 y.o. female  with past medical history of severe PAD, CAD s/p stenting, polysubstance abuse, and chronic mesenteric ischemia, who was admitted on 06/22/2020 with severe pain in both legs.  She was found to have complications of severe vascular disease including aortoiliac occlusive disease with occlusion of her celiac axis,  SMA, and both external iliac arteries.  She was being followed by vascular surgery, general surgery and cardiology.  On 2/25 in the early am she had a severe decline.  Imaging revealed mesenteric ischemia with air in her liver, high grade small bowel obstruction, and RLL pneumonia.  Her lactic acid was greater than 11. Critical care evaluated the patient and spoke with family.  Code status was changed to DNR and the goal of care was changed to comfort.  PMT was asked to see the patient and family as she has 9 siblings and there seems to be some inconsistent thinking amongst family members regarding [place].  Clinical Assessment and Goals of Care:  I have reviewed medical records including EPIC notes, labs and imaging, received report from surgery, CCM and ICU RN, examined the patient and met at bedside with her brother Dominica Severin  to discuss diagnosis prognosis, [place], EOL wishes, disposition and options.  I introduced Palliative Medicine as specialized medical care for people living with serious illness. It focuses on providing relief from the symptoms and stress of a serious illness.   We discussed a brief life review of the patient. The patient is 1 of 10 children.  She is number  5.  One brother is deceased.  She has no children.   Josseline Burt Knack, PA-C and I listened to [place] at [employer]. He expresses his strong religious faith.  He mentions Jehovah so I wondered if he was a Restaurant manager, fast food.  God has spoken to [place] on several occasional and lead him to help heal several people.   I asked if God had spoken to him about Frances yet.  He has not.  Dominica Severin wants his sister well cared for and comfortable.  He will never "give up" on her.  God will decide when it is Concettina's time to leave this world.  Questions and concerns were addressed.  Empathetic listening and support provided.  Primary Decision Maker:  The majority of patient's 8 siblings.    SUMMARY OF RECOMMENDATIONS     Continue to support patient keeping her as comfortable as possible.  Family is aware that "according to the numbers" and everything that we can see from a medical perspective she is unlikely to survive.  Family will need to continue to express their hopes and prayers for a miracle - this is an important part of their support for their sister and their processing.  Unrestricted visitation status - allow family to visit.  Recommend against escalating care as doing so would cause suffering for Ms. Danne Baxter.  Frequent oral care to help prevent thirst  Code Status/Advance Care Planning:  DNR   Symptom Management:   Dilaudid gtt with boluses as needed.  Agree with other comfort measures such as ativan and robinul PRN  Palliative Prophylaxis:   Frequent Pain Assessment  Psycho-social/Spiritual:   Desire for further Chaplaincy support: Welcomed.  Prognosis: hours to days.    Discharge Planning: Anticipated Hospital Death      Primary Diagnoses: Present on Admission: . Hypertensive urgency . Nicotine dependence, cigarettes, uncomplicated . Coronary artery disease involving native coronary artery of native heart without angina pectoris . Mixed hyperlipidemia . GERD without  esophagitis . Ischemic leg pain   I have reviewed the medical record, interviewed the patient and family, and examined the patient. The following aspects are pertinent.  Past Medical History:  Diagnosis Date  . Blind left eye   . Coronary artery disease   . GERD (gastroesophageal reflux disease)   . Hyperlipidemia   . Hypertension   . PAD (peripheral artery disease) ([place])   . Stroke [facility]) 1997   Social History   Socioeconomic History  . Marital status: Single    Spouse name: Not on file  . Number of children: Not on file  . Years of education: Not on file  . Highest education level: Not on file  Occupational History  . Not on file  Tobacco Use  . Smoking status: Current Every Day Smoker    Packs/day: 0.25    Years: 3.00    Pack years: 0.75    Types: Cigarettes  . Smokeless tobacco: Never Used  Vaping Use  . Vaping Use: Never used  Substance and Sexual Activity  . Alcohol use: Yes    Comment: socially   . Drug use: Not Currently  . Sexual activity: Not Currently  Other Topics Concern  . Not on file  Social History Narrative  . Not on file   Social Determinants of Health   Financial Resource Strain: Not on file  Food Insecurity: Not on file  Transportation Needs: Not on file  Physical Activity: Not on file  Stress: Not on file  Social Connections: Not on file   Family History  Problem Relation Age of Onset  . Hypertension Mother     Allergies  Allergen Reactions  . Lisinopril Cough  . Lovastatin Cough   Patient is awake, confused, pleasant, has no respiratory distress.  She does complain of pain in her legs.  Vital Signs: BP 94/73   Pulse 97   Temp 98.7 F (37.1 C)   Resp (!) 38   Ht '5\' 1"'  (1.549 m)   Wt 39.3 kg   SpO2 100%   BMI 16.37 kg/m  Pain Scale: CPOT POSS *See Group Information*: 1-Acceptable,Awake and alert Pain Score: 0-No pain   SpO2: SpO2: 100 % O2 Device:SpO2: 100 % O2 Flow Rate: .O2 Flow Rate (L/min): 2 L/min     Palliative Assessment/Data: 10%     Time In: 2:30 Time Out: 3:30 Time Total: 60 min. Visit consisted of counseling and education dealing with the complex and emotionally intense issues surrounding the need for palliative care and symptom management in the setting of serious and potentially life-threatening illness. Greater than 50%  of this time was spent counseling and coordinating care related to the above assessment and plan.  Signed by: Florentina Jenny, PA-C Palliative  Medicine  Please contact Palliative Medicine Team phone at [phone] for questions and concerns.  For individual provider: See Shea Evans

## 2020-06-23 NOTE — Progress Notes (Addendum)
Called by RN. BP has decreased again to 88/66 with MAP of 73. Pt is alert and complaining of feeling SOB. Oxygen sat is 100% on NRB.  Labs reviewed.   Check CXR, ABG to assess if improvement in acidosis, check EKG Give LR 1000 ml bolus. Monitor BP. If BP does not improve will rediscuss with PCCM  Addendum: Pt required another dose of narcan for being unresponsive. Now responsive and answering questions. BP is labile. ABG shows worsening metabolic acidosis with pH now 6.283 and low bicarb.  Discussed with PCCM Recommends checking procalcitonin, blood cultures. Recheck BMP to make sure bicarb is actually low although I suspect it is as it correlates with ABG bicarb level.  Given dose of sodium bicarb.  Start Bipap therapy to support respiratory function.   Hgb has dropped 3 g/dl since admission. Obtain CT abdomen pelvis without contrast to rule out retroperitoneal bleeding with no melana or signs of GI or other bleeding  If mentation changes then call PCCM back and they will transfer to ICU for further management.

## 2020-06-23 NOTE — Sepsis Progress Note (Signed)
Notified bedside nurse of need to draw blood cultures x1 before giving antibiotics, first sent already.

## 2020-06-23 NOTE — Progress Notes (Signed)
RT NOTE:  ABG results reported to RN. MD gave verbal order for BIPAP. At this time patient is protecting airway w/normal respiratory drive. Pt would benefit from bicarb drip. BIPAP not indicated at this time.  Results for OLIE, SCAFFIDI (MRN [id number]) as of 07/16/20 0136  Ref. Range 07/16/20 01:36  Sample type Unknown ARTERIAL DRAW  FIO2 Unknown 100.00  pH, Arterial Latest Ref Range: 7.350 - 7.450  7.185 (LL)  pCO2 arterial Latest Ref Range: 32.0 - 48.0 mmHg <19.0 (LL)  pO2, Arterial Latest Ref Range: 83.0 - 108.0 mmHg 429 (H)  Acid-base deficit Latest Ref Range: 0.0 - 2.0 mmol/L 20.0 (H)  Bicarbonate Latest Ref Range: 20.0 - 28.0 mmol/L 6.9 (L)  O2 Saturation Latest Units: % 98.9  Patient temperature Unknown 37.0  Collection site Unknown RIGHT RADIAL  Allens test (pass/fail) Latest Ref Range: PASS  PASS

## 2020-06-23 NOTE — Progress Notes (Signed)
Progress Note  Patient Name: Cindy Hess Date of Encounter: 06/06/2020  [facility] Cardiologist:  New to [facility]    Subjective   Pt has declined significantly overnight She is hypothermic, acidotic , hypoglycemic, BP has fallen dramatically   She has severe ischemic bowel. Vascular surgery has evaluated her and thinks the likelyhood of being able to restore blood flow to the bowel is low.    Inpatient Medications    Scheduled Meds: . sodium chloride   Intravenous Once  . sodium chloride   Intravenous Once  . sodium chloride   Intravenous Once  . acetaminophen  650 mg Oral Once  . amLODipine  10 mg Oral Daily  . aspirin EC  81 mg Oral Daily  . atorvastatin  40 mg Oral Daily  . carvedilol  25 mg Oral BID WC  . Chlorhexidine Gluconate Cloth  6 each Topical Daily  . diphenhydrAMINE  25 mg Intravenous Once  . feeding supplement  237 mL Oral TID BM  . glucose      . hydrALAZINE  10 mg Oral Q8H  . pantoprazole  40 mg Oral Daily   Continuous Infusions: . sodium chloride    . ceFEPime (MAXIPIME) IV    . lactated ringers    . metronidazole    . norepinephrine (LEVOPHED) Adult infusion 2 mcg/min (05/29/2020 0733)  . sodium bicarbonate (isotonic) 150 mEq in D5W 1000 mL infusion 100 mL/hr at 06/17/2020 0600   PRN Meds: acetaminophen **OR** acetaminophen, hydrALAZINE, oxyCODONE-acetaminophen **OR** morphine injection, naLOXone (NARCAN)  injection, ondansetron **OR** ondansetron (ZOFRAN) IV, polyethylene glycol   Vital Signs    Vitals:   05/28/2020 0600 06/20/2020 0700 06/22/2020 0715 06/10/2020 0730  BP: (!) 81/64 (!) 83/59 (!) 81/56 (!) 74/58  Pulse: 62 71 71 71  Resp: 13 (!) 39 (!) 39 (!) 33  Temp: (!) 87.9 F (31.1 C)     TempSrc: Rectal     SpO2: 100% 100% 100% 100%  Weight:      Height:        Intake/Output Summary (Last 24 hours) at 06/05/2020 0837 Last data filed at 06/15/2020 0600 Gross per 24 hour  Intake 351.99 ml  Output 850 ml  Net -498.01 ml   Last 3  Weights 06/17/2020 June 27, 2020 10/07/2019  Weight (lbs) 86 lb 10.3 oz 100 lb 103 lb  Weight (kg) 39.3 kg 45.36 kg 46.72 kg      Telemetry    NSR - Personally Reviewed  ECG    - Personally Reviewed  Physical Exam   GEN:  Middle age female,  Carmin [place].   Moans occasionally   , has warming blanket on  Neck: No JVD Cardiac: RRR, no murmurs, rubs, or gallops.  Respiratory: Clear to auscultation bilaterally. GI: no BS  MS:no distal leg pulses Neuro:  Nonfocal  Psych: Normal affect   Labs    High Sensitivity Troponin:   Recent Labs  Lab 06/17/20 2158 06/06/2020 0036  TROPONINIHS 19* 22*      Chemistry Recent Labs  Lab 06/17/20 0310 06/17/20 1347 06/05/2020 0036 06/15/2020 2350 06/10/2020 0351 06/22/2020 0649  NA 140 138   < > 135 142 141  K 3.3* 3.7   < > 4.6 4.5 4.0  CL 102 101   < > 105 110 103  CO2 22 24   < > 10* 9* 11*  GLUCOSE 140* 123*   < > 330* 34* 165*  BUN 13 11   < > 20 21 20   CREATININE  0.54 0.42*   < > 1.13* 1.14* 1.27*  CALCIUM 9.5 9.2   < > 8.7* 8.8* 8.7*  PROT 7.5 7.3  --   --   --  4.9*  ALBUMIN 3.0* 2.7*  --   --   --  1.8*  AST 22 19  --   --   --  1,637*  ALT 17 13  --   --   --  712*  ALKPHOS 94 93  --   --   --  77  BILITOT 0.9 0.8  --   --   --  1.0  GFRNONAA >60 >60   < > 54* 53* 47*  ANIONGAP 16* 13   < > 20* 23* 27*   < > = values in this interval not displayed.     Hematology Recent Labs  Lab 05/26/2020 0036 06/13/2020 2350 06/12/2020 0649  WBC 19.7* 18.4* 13.1*  RBC 4.47 4.05 3.87  HGB 12.9 11.8* 11.1*  HCT 37.9 35.8* 34.7*  MCV 84.8 88.4 89.7  MCH 28.9 29.1 28.7  MCHC 34.0 33.0 32.0  RDW 13.4 13.9 14.3  PLT 297 232 213    BNPNo results for input(s): BNP, PROBNP in the last 168 hours.   DDimer No results for input(s): DDIMER in the last 168 hours.   Radiology    CT ABDOMEN PELVIS WO CONTRAST  Addendum Date: 06/06/2020   ADDENDUM REPORT: 05/30/2020 03:31 ADDENDUM: Results were discussed with Dr. Rachael Darby at 3:29 a.m. [place] on  June 19, 2020. Electronically Signed   By: Aram Candela M.D.   On: 05/30/2020 03:31   Result Date: 06/06/2020 CLINICAL DATA:  Rule out retroperitoneal bleeding. EXAM: CT ABDOMEN AND PELVIS WITHOUT CONTRAST TECHNIQUE: Multidetector CT imaging of the abdomen and pelvis was performed following the standard protocol without IV contrast. COMPARISON:  August 16, 2018 FINDINGS: Lower chest: Mild areas of patchy infiltrate are seen within the posteromedial aspect of the right lower lobe. Mild posterior left basilar scarring and/or atelectasis is also noted. Hepatobiliary: A small amount of air is seen within the anterior aspect of the right lobe of the liver. Contrast attenuation is seen throughout the lumen of the gallbladder. Pancreas: Unremarkable. No pancreatic ductal dilatation or surrounding inflammatory changes. Spleen: Normal in size without focal abnormality. Adrenals/Urinary Tract: Adrenal glands are unremarkable. Kidneys are normal, without renal calculi, focal lesion, or hydronephrosis. The urinary bladder is poorly visualized. Stomach/Bowel: There is moderate severity gastric distension. The appendix is not visualized. Multiple dilated small bowel loops are seen throughout the abdomen and pelvis (maximum small bowel diameter of approximately 4.2 cm) air is also seen within the wall of multiple distal small bowel loops. Vascular/Lymphatic: Marked severity aortic atherosclerosis. Bilateral external iliac artery stents are in place. No enlarged abdominal or pelvic lymph nodes. Reproductive: Uterus and bilateral adnexa are unremarkable. Other: No abdominal wall hernia or abnormality. Air is seen within multiple branching mesenteric vessels (axial CT images 41 through 65, CT series number 3). No abdominopelvic ascites. Musculoskeletal: No acute or significant osseous findings. IMPRESSION: 1. High-grade distal small bowel obstruction. 2. Portal venous air within the right lobe of the liver with a moderate  amount of air noted throughout the mesenteric vessels, consistent with sequelae associated with mesenteric ischemia. 3. Mild right lower lobe infiltrate. 4. Bilateral external iliac artery stents. 5. Aortic atherosclerosis. Aortic Atherosclerosis (ICD10-I70.0). Electronically Signed: By: Aram Candela M.D. On: 06/11/2020 03:27   DG Chest 1 View  Result Date: 06/03/2020 CLINICAL DATA:  Shortness  of breath and weakness. EXAM: CHEST  1 VIEW COMPARISON:  June 17, 2020 FINDINGS: The lungs are hyperinflated. There is no evidence of acute infiltrate, pleural effusion or pneumothorax. The heart size and mediastinal contours are within normal limits. The visualized skeletal structures are unremarkable. IMPRESSION: No active disease. Electronically Signed   By: Aram Candela M.D.   On: 05/26/2020 01:18   DG Chest 1 View  Result Date: 06/17/2020 CLINICAL DATA:  Chest pain, shortness of breath, elevated blood pressure EXAM: CHEST  1 VIEW COMPARISON:  Radiograph 06/08/2020 FINDINGS: Mild hyperinflation similar to comparison exam. No consolidation, features of edema, pneumothorax, or effusion. Pulmonary vascularity is normally distributed. The cardiomediastinal contours are unremarkable. No acute osseous or soft tissue abnormality. The cardiomediastinal contours are unremarkable. No acute osseous or soft tissue abnormality. Degenerative changes are present in the imaged spine and shoulders. Telemetry leads overlie the chest. IMPRESSION: No acute cardiopulmonary disease. Chronic hyperinflation. Electronically Signed   By: Kreg Shropshire M.D.   On: 06/17/2020 22:17   DG CHEST PORT 1 VIEW  Result Date: 05/26/2020 CLINICAL DATA:  Central line and nasogastric tube placement. EXAM: PORTABLE CHEST 1 VIEW COMPARISON:  06/02/2020 FINDINGS: RIGHT IJ central line has been placed, tip overlying the level of superior vena cava. Nasogastric tube is in place, tip beyond the image and beyond the level of the mid stomach.  There is gaseous distension of the stomach. Heart size is normal.  Lungs are clear.  No edema.  No pneumothorax. IMPRESSION: Interval placement of RIGHT IJ central line. No pneumothorax. Electronically Signed   By: Norva Pavlov M.D.   On: 06/22/2020 08:18    Cardiac Studies      Patient Profile     65 y.o. female with severe PAD    Assessment & Plan    1.   HTN:  She developed severe hypotension overnight.  Complicated by acidosis, hypoglycemia,   She required pressors for a while.   Now pressors are off Will continue to follow   We have ordered an echo  Which will be done later today   Her prognosis is very poor  following       For questions or updates, please contact [facility] Please consult [email or web address] for contact info under        Signed, Kristeen Miss, MD  06/09/2020, 8:37 AM

## 2020-06-23 NOTE — Progress Notes (Signed)
Subjective: Patient just babbling.  Confused.    ROS: See above, otherwise other systems negative  Objective: Vital signs in last 24 hours: Temp:  [87.9 F (31.1 C)-98.5 F (36.9 C)] 90.7 F (32.6 C) (02/25 0832) Pulse Rate:  [59-96] 74 (02/25 0830) Resp:  [13-39] 30 (02/25 0848) BP: (74-224)/(55-107) 124/74 (02/25 0848) SpO2:  [91 %-100 %] 100 % (02/25 0830) Last BM Date: 07-01-20  Intake/Output from previous day: 02/24 0701 - 02/25 0700 In: 352 [I.V.:352] Out: 850 [Emesis/NG output:850] Intake/Output this shift: No intake/output data recorded.  PE: Gen: ill-appearing Abd: distended, somewhat tender, but not as much as I expected.  Distended, NGT in place with dark thick brown output  Lab Results:  Recent Labs    07-01-2020 2350 05/31/2020 0649  WBC 18.4* 13.1*  HGB 11.8* 11.1*  HCT 35.8* 34.7*  PLT 232 213   BMET Recent Labs    06/10/2020 0351 05/27/2020 0649  NA 142 141  K 4.5 4.0  CL 110 103  CO2 9* 11*  GLUCOSE 34* 165*  BUN 21 20  CREATININE 1.14* 1.27*  CALCIUM 8.8* 8.7*   PT/INR Recent Labs    06/21/2020 1112 06/06/2020 0324  LABPROT 15.5* 26.7*  INR 1.3* 2.6*   CMP     Component Value Date/Time   NA 141 06/17/2020 0649   NA 143 12/20/2017 1207   K 4.0 06/02/2020 0649   CL 103 06/11/2020 0649   CO2 11 (L) 06/09/2020 0649   GLUCOSE 165 (H) 06/09/2020 0649   BUN 20 06/13/2020 0649   BUN 12 12/20/2017 1207   CREATININE 1.27 (H) 06/10/2020 0649   CALCIUM 8.7 (L) 06/02/2020 0649   PROT 4.9 (L) 06/05/2020 0649   PROT 8.1 12/20/2017 1207   ALBUMIN 1.8 (L) 06/17/2020 0649   ALBUMIN 4.6 12/20/2017 1207   AST 1,637 (H) 06/04/2020 0649   ALT 712 (H) 06/17/2020 0649   ALKPHOS 77 06/01/2020 0649   BILITOT 1.0 06/01/2020 0649   BILITOT 0.6 12/20/2017 1207   GFRNONAA 47 (L) 06/11/2020 0649   GFRAA >60 08/15/2018 2323   Lipase     Component Value Date/Time   LIPASE 29 08/15/2018 2323       Studies/Results: CT ABDOMEN PELVIS WO  CONTRAST  Addendum Date: 06/03/2020   ADDENDUM REPORT: 06/15/2020 03:31 ADDENDUM: Results were discussed with Dr. Rachael Darby at 3:29 a.m. [place] on June 19, 2020. Electronically Signed   By: Aram Candela M.D.   On: 06/02/2020 03:31   Result Date: 06/15/2020 CLINICAL DATA:  Rule out retroperitoneal bleeding. EXAM: CT ABDOMEN AND PELVIS WITHOUT CONTRAST TECHNIQUE: Multidetector CT imaging of the abdomen and pelvis was performed following the standard protocol without IV contrast. COMPARISON:  August 16, 2018 FINDINGS: Lower chest: Mild areas of patchy infiltrate are seen within the posteromedial aspect of the right lower lobe. Mild posterior left basilar scarring and/or atelectasis is also noted. Hepatobiliary: A small amount of air is seen within the anterior aspect of the right lobe of the liver. Contrast attenuation is seen throughout the lumen of the gallbladder. Pancreas: Unremarkable. No pancreatic ductal dilatation or surrounding inflammatory changes. Spleen: Normal in size without focal abnormality. Adrenals/Urinary Tract: Adrenal glands are unremarkable. Kidneys are normal, without renal calculi, focal lesion, or hydronephrosis. The urinary bladder is poorly visualized. Stomach/Bowel: There is moderate severity gastric distension. The appendix is not visualized. Multiple dilated small bowel loops are seen throughout the abdomen and pelvis (maximum small bowel diameter of approximately 4.2  cm) air is also seen within the wall of multiple distal small bowel loops. Vascular/Lymphatic: Marked severity aortic atherosclerosis. Bilateral external iliac artery stents are in place. No enlarged abdominal or pelvic lymph nodes. Reproductive: Uterus and bilateral adnexa are unremarkable. Other: No abdominal wall hernia or abnormality. Air is seen within multiple branching mesenteric vessels (axial CT images 41 through 65, CT series number 3). No abdominopelvic ascites. Musculoskeletal: No acute or significant  osseous findings. IMPRESSION: 1. High-grade distal small bowel obstruction. 2. Portal venous air within the right lobe of the liver with a moderate amount of air noted throughout the mesenteric vessels, consistent with sequelae associated with mesenteric ischemia. 3. Mild right lower lobe infiltrate. 4. Bilateral external iliac artery stents. 5. Aortic atherosclerosis. Aortic Atherosclerosis (ICD10-I70.0). Electronically Signed: By: Aram Candela M.D. On: 07-09-20 03:27   DG Chest 1 View  Result Date: 07-09-2020 CLINICAL DATA:  Shortness of breath and weakness. EXAM: CHEST  1 VIEW COMPARISON:  June 17, 2020 FINDINGS: The lungs are hyperinflated. There is no evidence of acute infiltrate, pleural effusion or pneumothorax. The heart size and mediastinal contours are within normal limits. The visualized skeletal structures are unremarkable. IMPRESSION: No active disease. Electronically Signed   By: Aram Candela M.D.   On: 2020/07/09 01:18   DG Chest 1 View  Result Date: 06/17/2020 CLINICAL DATA:  Chest pain, shortness of breath, elevated blood pressure EXAM: CHEST  1 VIEW COMPARISON:  Radiograph 06/21/2020 FINDINGS: Mild hyperinflation similar to comparison exam. No consolidation, features of edema, pneumothorax, or effusion. Pulmonary vascularity is normally distributed. The cardiomediastinal contours are unremarkable. No acute osseous or soft tissue abnormality. The cardiomediastinal contours are unremarkable. No acute osseous or soft tissue abnormality. Degenerative changes are present in the imaged spine and shoulders. Telemetry leads overlie the chest. IMPRESSION: No acute cardiopulmonary disease. Chronic hyperinflation. Electronically Signed   By: Kreg Shropshire M.D.   On: 06/17/2020 22:17   DG CHEST PORT 1 VIEW  Result Date: 07-09-20 CLINICAL DATA:  Central line and nasogastric tube placement. EXAM: PORTABLE CHEST 1 VIEW COMPARISON:  09-Jul-2020 FINDINGS: RIGHT IJ central line has been  placed, tip overlying the level of superior vena cava. Nasogastric tube is in place, tip beyond the image and beyond the level of the mid stomach. There is gaseous distension of the stomach. Heart size is normal.  Lungs are clear.  No edema.  No pneumothorax. IMPRESSION: Interval placement of RIGHT IJ central line. No pneumothorax. Electronically Signed   By: Norva Pavlov M.D.   On: 07-09-20 08:18    Anti-infectives: Anti-infectives (From admission, onward)   Start     Dose/Rate Route Frequency Ordered Stop   2020-07-09 0800  ceFAZolin (ANCEF) IVPB 1 g/50 mL premix  Status:  Discontinued       Note to Pharmacy: Send with pt to OR   1 g 100 mL/hr over 30 Minutes Intravenous On call 06/22/2020 0726 09-Jul-2020 0612   2020/07/09 0700  metroNIDAZOLE (FLAGYL) IVPB 500 mg        500 mg 100 mL/hr over 60 Minutes Intravenous Every 8 hours 2020-07-09 0612     Jul 09, 2020 0615  ceFEPIme (MAXIPIME) 2 g in sodium chloride 0.9 % 100 mL IVPB        2 g 200 mL/hr over 30 Minutes Intravenous Every 12 hours July 09, 2020 0612         Assessment/Plan PAD Chronic mesenteric ischemia HTN AKI Tobacco dependence  Sepsis, likely secondary to ischemia bowel The patient is critically ill  with hypothermia, hypotension, significant lactic acidosis, who appears to have ischemic bowel on her CT scan.  Her last INR was 2.6.  She is currently receiving FFP as ordered earlier this morning with the chance that if this corrects and family wanted to proceed, we could attempt surgical intervention.  If the family still wants to pursue aggressive care, we can still attempt to correct this and try for surgical intervention.  However, her labs and overall picture are very concerning and we feel the patient has a grim prognosis even with surgical intervention.  We feel given her progression of deterioration it is reasonable to discuss with the family comfort measures and not pursue an operation if they are agreeable.  Her case has been  discussed with vascular surgery as well as CCM who all agree that her prognosis and chance of recovering is very poor.  We will continue to follow and await further family decisions on how to move forward.  FEN - npo/NGT VTE - on hold, FFP to correct INR ID - cefepime/flagyl   LOS: 2 days    Letha Cape , [facility] 09-Jul-2020, 9:23 AM Please see Amion for pager number during day hours 7:00am-4:30pm or 7:00am -11:30am on weekends

## 2020-06-23 NOTE — Progress Notes (Signed)
Arterial line ordered by Dr. Warrick Parisian. Aline attempted twice by MD unable to obtain. MD will pass along that patient needs arterial line.

## 2020-06-23 NOTE — Progress Notes (Signed)
Nutrition Brief Note  Chart reviewed. Pt now transitioning to comfort care.  No further nutrition interventions warranted at this time.  Please re-consult as needed.   Everitt Wenner RD, LDN Clinical Nutrition Pager listed in AMION    

## 2020-06-23 NOTE — Progress Notes (Signed)
19ml Hydromorphone (dialudid) wasted in supply room with Maureen Ralphs, Charity fundraiser (Press photographer) post-mortem.

## 2020-06-23 NOTE — Procedures (Signed)
Central Venous Catheter Insertion Procedure Note  Cindy Hess  [id number]  16-Jan-1956  Date:06/04/2020  Time:7:01 AM   Provider Performing:Ilaria Much B Jaran Sainz   Procedure: Insertion of Non-tunneled Central Venous Catheter(36556) with US guidance (30092)   Indication(s) Medication administration and Difficult access  Consent Unable to obtain consent due to emergent nature of procedure.  Anesthesia Topical only with 1% lidocaine   Timeout Verified patient identification, verified procedure, site/side was marked, verified correct patient position, special equipment/implants available, medications/allergies/relevant history reviewed, required imaging and test results available.  Sterile Technique Maximal sterile technique including full sterile barrier drape, hand hygiene, sterile gown, sterile gloves, mask, hair covering, sterile ultrasound probe cover (if used).  Procedure Description Area of catheter insertion was cleaned with chlorhexidine and draped in sterile fashion.  With real-time ultrasound guidance a central venous catheter was placed into the right internal jugular vein. Nonpulsatile blood flow and easy flushing noted in all ports.  The catheter was sutured in place and sterile dressing applied.  Complications/Tolerance None; patient tolerated the procedure well. Chest X-ray is ordered to verify placement for internal jugular or subclavian cannulation.   Chest x-ray is not ordered for femoral cannulation.  EBL Minimal  Specimen(s) None

## 2020-06-23 NOTE — Progress Notes (Signed)
   06/04/2020 0030  Assess: MEWS Score  Temp 98.5 F (36.9 C)  BP (!) 88/66  Pulse Rate 69  ECG Heart Rate 67  Resp (!) 25  Level of Consciousness Responds to Voice  SpO2 100 %  O2 Device Non-rebreather Mask  Patient Activity (if Appropriate) In bed  O2 Flow Rate (L/min) 15 L/min  Assess: MEWS Score  MEWS Temp 0  MEWS Systolic 1  MEWS Pulse 0  MEWS RR 1  MEWS LOC 1  MEWS Score 3  MEWS Score Color Yellow  Assess: if the MEWS score is Yellow or Red  Were vital signs taken at a resting state? Yes  Focused Assessment Change from prior assessment (see assessment flowsheet)  Early Detection of Sepsis Score *See Row Information* Medium  MEWS guidelines implemented *See Row Information* Yes  Treat  MEWS Interventions Escalated (See documentation below);Administered prn meds/treatments;Administered scheduled meds/treatments;Consulted Respiratory Therapy  Pain Scale 0-10  Pain Score 6  Pain Type Acute pain  Pain Location Abdomen  Pain Orientation Mid  Pain Descriptors / Indicators Constant;Contraction;Burning  Pain Frequency Intermittent  Pain Onset Progressive  Pain Intervention(s) Therapeutic touch;Relaxation  Take Vital Signs  Increase Vital Sign Frequency  Yellow: Q 2hr X 2 then Q 4hr X 2, if remains yellow, continue Q 4hrs  Escalate  MEWS: Escalate Yellow: discuss with charge nurse/RN and consider discussing with provider and RRT  Notify: Charge Nurse/RN  Name of Charge Nurse/RN Notified Nicole, RN  Date Charge Nurse/RN Notified Jul 16, 2020  Time Charge Nurse/RN Notified 0030  Notify: Provider  Provider Name/Title Dr. Rachael Darby  Date Provider Notified 07-16-2020  Time Provider Notified 0030  Notification Type Page  Notification Reason Change in status  Notify: Rapid Response  Name of Rapid Response RN Notified Onalee Hua, RN (Rechecked on pt)  Date Rapid Response Notified 05/29/2020  Time Rapid Response Notified 0030  Document  Patient Outcome Transferred/level of care increased   Progress note created (see row info) Yes   Rapid response called multiple over the course of pt's time on this floor. "Code Blue" also initiated initially d/t drop in HR to 20's and agonal breathing. Never lost a pulse however, so no compressions needed, just the use of a BVM until pt was able to regain control of her airway. MD Chotiner aware and consulted with critical care multiple times. At approximately 0430 pt was transferred to [facility] for higher level of care.

## 2020-06-23 NOTE — Sepsis Progress Note (Signed)
Monitoring for code sepsis protocol. 

## 2020-06-23 NOTE — Progress Notes (Signed)
VASCULAR SURGERY:  This patient developed ischemic bowel last night and now the right leg is profoundly ischemic.  As per my previous notes there are no options for mesenteric revascularization.  Thus, there is nothing further to offer from a vascular standpoint.  Appreciate general surgery's help.  I had a long discussion with the patient and all of her family members at the bedside.  All their questions were answered.  They seem to understand the gravity of the situation.  [name], MD Office: [phone]

## 2020-06-23 NOTE — Progress Notes (Addendum)
NAME:  Cindy Hess, MRN:  [id number], DOB:  1955-08-13, LOS: 2 ADMISSION DATE:  05/30/2020, CONSULTATION DATE:  24-Jun-2020 REFERRING MD:   Elige Radon Chotiner CHIEF COMPLAINT:  Hypotension/Unreponsive  Brief History:  Cindy Hess is a 65 year old woman, daily smoker with hypertension and severe peripheral artery disease, CAD s/p stenting and hyperlipidemia who presented on 2/23 with severe pain and weakness in both of her legs. 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  On 2/24 in the evening she had a rapid response called as she was found unresponsive. This was thought to be secondary to narcotic pain medication as she initially responded to narcan. She then had a second unresponsive episode which again responded to narcan and fluid administration. Labs revealed a serum bicarb of 10 and an initial blood gas with pH 7.2, pCO 22, pO2 344, HCO3 9.9 and a follow up gas with pH 7.18, pCO2 <19, pO2 429 and HCO3 6.9. Lactic acid returned at >11. Hemoglobin had trended down to 11g/dL from 14 on admission. CT abdomen pelvis was done to look for retroperitoneal bleed but discovered air throughout the mesenteric vessels consistent with mesenteric ischemia.   Based on concerning trajectory of patient she was transferred to the ICU for further care on 2/25.   Past Medical History:  CAD PVD Tobacco Use HTN HLD  Significant Hospital Events:  2/24 - rapid response  Consults:  Interventional Radiology Vascular Surgery  Procedures:    Significant Diagnostic Tests:  CT abdomen 2/25 1. High-grade distal small bowel obstruction. 2. Portal venous air within the right lobe of the liver with a moderate amount of air noted throughout the mesenteric vessels, consistent with sequelae associated with  mesenteric ischemia. 3. Mild right lower lobe infiltrate. 4. Bilateral external iliac artery stents. 5. Aortic atherosclerosis.  Micro Data:  Respiratory panel 2/22 > negative MRSA PCR 2/23 > negative Blood cultures 2/25 >   Antimicrobials:  Flagyl 2/25  Interim History / Subjective:  Lying in bed muttering incoherently  Objective   Blood pressure 124/74, pulse 74, temperature (!) 90.7 F (32.6 C), resp. rate (!) 30, height 5\' 1"  (1.549 m), weight 39.3 kg, SpO2 100 %.        Intake/Output Summary (Last 24 hours) at 24-Jun-2020 0920 Last data filed at 2020-06-24 0600 Gross per 24 hour  Intake 351.99 ml  Output 850 ml  Net -498.01 ml   Filed Weights   06/20/2020 1108 06/17/20 1220  Weight: 45.4 kg 39.3 kg    Examination: General: Acute on chronic thin middle-aged female lying in bed in moderate nonverbal discomfort HEENT: [place]/AT, MM pink/moist, PERRL,  Neuro:  Eyes closed ,muttering incoherently, unable to follow commands CV: s1s2 regular rate and rhythm, no murmur, rubs, or gallops,  PULM: Diminished air entry bilaterally, tachypnea, oxygen saturations appropriate on room air GI: soft, bowel sounds active absent in all quadrants, abdomen is distended, NG tube with dark output Extremities: warm/dry, no edema  Skin: no rashes or lesions   Resolved Hospital Problem list     Assessment & Plan:  Patient was evaluated overnight by general surgery and vascular surgery, given acute decompensation patient was deemed not a surgical candidate at time of evaluation however if patient can be resuscitated general surgery and vascular surgery would be amendable to surgical  intervention including ex lap  Despite resuscitation patient and possible surgery patient will likely not survive this hospitalization.  Exploratory laparotomy will likely reveal significant ischemia that cannot be surgically repaired.  Therefore goals of care discussion with family should be next steps.  I have spoken  with patient's brother this morning and family will gather at hospital as soon as possible.  In the meantime we will interventions below   Acute on chronic mesenteric ischemia -Evidence of pneumatosis on CT this coupled with acute decompensation is concerning for acute bowel ischemia  Small Bowel Obstruction P: Continue with resuscitation  Transfuse 2 units FFP for elevated INR Supportive care Heparin drip on hold per CCS NG tube in place to low intermittent wall suction Warming blanket Hold on A-line placement given improvement in BP   Acute Kidney Injury Severe Metabolic Acidosis -In setting of mesenteric ischemia Lactic Acidosis -Lactic acid remains greater than 11 despite resuscitation P: Continue bicarb drip Patient responded well to IV bolus, can repeat if blood pressure trends back down Follow renal function / urine output Trend Bmet Avoid nephrotoxins Ensure adequate renal perfusion  IV hydration  Shock liver -Secondary to acute bowel ischemia, patient seen with significant elevations in AST of 1637 and ALT 712 Coagulopathy -INR 2.6 -In the setting of heparin drip and acute bowel ischemia P: Avoid hepatotoxins Supportive care Resuscitative efforts as above FFP transfusion as above  Peripheral Vascular Disease Occlusion of celiac trunk, 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  Unfortunately patient likely has no surgical revascularization options left. Supportive care   Mixed hyperlipidemia P: Continue Lipitor  GERD without esophagitis P: PPI  Best practice (evaluated daily)  Diet: NPO Pain/Anxiety/Delirium protocol (if indicated): IV morphine VAP protocol (if indicated): n/a DVT prophylaxis: heparin gtt GI prophylaxis: PPI Glucose control: n/a Mobility: bed  rest Disposition: ICU  Goals of Care:  Last date of multidisciplinary goals of care discussion: Family and staff present:  Summary of discussion:  Follow up goals of care discussion due:  Code Status: full  Labs   CBC: Recent Labs  Lab 06/21/2020 1112 06/17/20 0310 06/17/20 0315 06/17/20 1102 11-Jul-2020 0036 2020-07-11 2350 06/01/2020 0649  WBC 16.0* 23.3* 23.0* 25.5* 19.7* 18.4* 13.1*  NEUTROABS 13.8* 19.7*  --   --   --   --  PENDING  HGB 14.9 14.8 14.8 14.6 12.9 11.8* 11.1*  HCT 44.6 44.4 45.2 43.5 37.9 35.8* 34.7*  MCV 85.9 87.2 86.4 85.1 84.8 88.4 89.7  PLT 334 344 342 340 297 232 213    Basic Metabolic Panel: Recent Labs  Lab 06/17/20 0310 06/17/20 1347 07/11/2020 0036 07-11-20 2350 05/30/2020 0351 06/13/2020 0649  NA 140 138 135 135 142 141  K 3.3* 3.7 3.4* 4.6 4.5 4.0  CL 102 101 103 105 110 103  CO2 22 24 22  10* 9* 11*  GLUCOSE 140* 123* 108* 330* 34* 165*  BUN 13 11 9 20 21 20   CREATININE 0.54 0.42* 0.37* 1.13* 1.14* 1.27*  CALCIUM 9.5 9.2 8.6* 8.7* 8.8* 8.7*  MG 2.0  --  1.8  --   --   --    GFR: Estimated Creatinine Clearance: 27.4 mL/min (A) (by C-G formula based on SCr of 1.27 mg/dL (H)). Recent Labs  Lab 06/13/2020 1113 06/17/20 0310 06/17/20 1102 06/17/20 1347 07/11/20 0036 11-Jul-2020 2350 06/05/2020 0125  06/05/2020 0351 06/17/2020 0649  PROCALCITON  --   --   --   --   --   --   --  6.87  --   WBC  --    < > 25.5*  --  19.7* 18.4*  --   --  13.1*  LATICACIDVEN 1.8  --   --  1.4  --   --  >11.0* >11.0*  --    < > = values in this interval not displayed.    Liver Function Tests: Recent Labs  Lab 06/14/2020 1112 06/17/20 0310 06/17/20 1347 05/28/2020 0649  AST 19 22 19  1,637*  ALT 16 17 13  712*  ALKPHOS 98 94 93 77  BILITOT 1.0 0.9 0.8 1.0  PROT 8.3* 7.5 7.3 4.9*  ALBUMIN 3.5 3.0* 2.7* 1.8*   No results for input(s): LIPASE, AMYLASE in the last 168 hours. No results for input(s): AMMONIA in the last 168 hours.  ABG    Component Value Date/Time    PHART 7.185 (LL) 06/13/2020 0136   PCO2ART <19.0 (LL) 06/10/2020 0136   PO2ART 429 (H) 06/06/2020 0136   HCO3 6.9 (L) 05/28/2020 0136   ACIDBASEDEF 20.0 (H) 05/29/2020 0136   O2SAT 98.9 06/08/2020 0136     Coagulation Profile: Recent Labs  Lab 05/27/2020 1112 06/16/2020 0324  INR 1.3* 2.6*    Cardiac Enzymes: No results for input(s): CKTOTAL, CKMB, CKMBINDEX, TROPONINI in the last 168 hours.  HbA1C: Hgb A1c MFr Bld  Date/Time Value Ref Range Status  06/17/2020 03:10 AM 6.0 (H) 4.8 - 5.6 % Final    Comment:    (NOTE) Pre diabetes:          5.7%-6.4%  Diabetes:              >6.4%  Glycemic control for   <7.0% adults with diabetes   12/20/2017 12:07 PM 5.8 (H) 4.8 - 5.6 % Final    Comment:             Prediabetes: 5.7 - 6.4          Diabetes: >6.4          Glycemic control for adults with diabetes: <7.0     CBG: Recent Labs  Lab May 30, 2020 2337 06/21/2020 0343 06/21/2020 0405 06/11/2020 0521 05/31/2020 0549  GLUCAP 234* 24* 358* 274* 369*    Critical care time:    Performed by: Delfin GantWhitney F Davis  Total critical care time: 35 minutes  Critical care time was exclusive of separately billable procedures and treating other patients.  Critical care was necessary to treat or prevent imminent or life-threatening deterioration.  Critical care was time spent personally by me on the following activities: development of treatment plan with patient and/or surrogate as well as nursing, discussions with consultants, evaluation of patient's response to treatment, examination of patient, obtaining history from patient or surrogate, ordering and performing treatments and interventions, ordering and review of laboratory studies, ordering and review of radiographic studies, pulse oximetry and re-evaluation of patient's condition.  Delfin GantWhitney F Davis, NP-C [facility] Personal contact information can be found on Amion  If no response please page: Adult pulmonary and  critical care medicine pager on Amion unitl 7pm After 7pm please call [phone] 06/22/2020, 9:41 AM    Pulmonary critical care attending:  This is a 65 year old female, daily smoker, longstanding history of tobacco abuse, hypertension, severe peripheral arterial disease, coronary disease status post stenting.  Patient was hypotensive brought to the emergency  department.  Had rapid response on 05/26/2020.  She has a ischemic bowel and ischemic leg.  The acute on chronic mesenteric ischemia was felt to be nonoperable.  She also has AKI with severe metabolic acidosis and lactic acidosis, shock liver.  She also has severe peripheral arterial disease vascular surgery felt as if there was no surgical revascularization option.  Patient was brought to the intensive care unit for resuscitation and ongoing goals of care discussion.  BP 94/73   Pulse 97   Temp 98.7 F (37.1 C)   Resp (!) 38   Ht 5\' 1"  (1.549 m)   Wt 39.3 kg   SpO2 100%   BMI 16.37 kg/m   General: Elderly female resting in bed seems to be in pain when moving around. HEENT: Tracking appropriately, NG tube in place Heart: Tachycardic regular Lungs: Shallow rapid breaths, no crackles no wheeze Abdomen: Distended tense to palpation, pain with palpation Extremities: Cool to the touch  Labs: Reviewed, severe metabolic acidosis  Assessment: Shock, septic shock related to acute on chronic mesenteric ischemia, likely component of bowel bacterial translocation, small bowel obstruction, ischemic right lower extremity.  Felt to have no options for mesenteric revascularization.  Also felt to be a poor candidate for surgical intervention on the bowel from general surgery.  Plan: Patient has received volume resuscitation.  Her pressure is somewhat more stable. No matter the case the underlying etiology which is driving her critical illness cannot be corrected surgically. I suspect the patient will pass despite ongoing support. Further  discussions with family ongoing. Current DNR with comfort care measures in place. We will consult palliative care to help with family understanding regarding current situation. Continue antimicrobials  This patient is critically ill with multiple organ system failure; which, requires frequent high complexity decision making, assessment, support, evaluation, and titration of therapies. This was completed through the application of advanced monitoring technologies and extensive interpretation of multiple databases. During this encounter critical care time was devoted to patient care services described in this note for 32 minutes.  , DO [facility] 2020-06-27 1:59 PM

## 2020-06-23 NOTE — Sepsis Progress Note (Signed)
LA discontinued, pt is full comfort care.

## 2020-06-23 NOTE — Progress Notes (Signed)
I was able to reach the patient's brother.  I informed him of the patient's change.  Discussed the change in her clinical condition.  Discussed the work-up and treatment rendered so far.  Discussed that concern for acute on chronic mesenteric ischemia.  Discussed my concern for the viability of a section of her small intestine.  Recommended exploratory abdominal surgery with probable bowel resection.  Discussed the possibility of a need for a second look, discussed the possibility of ostomy.  Discussed the possibility of anastomosis with leak.  Discussed wound infection, incisional hernia, blood clot, perioperative cardiac and pulmonary events, renal failure, need for additional procedures, death, if survives hospitalization high probability of needing skilled nursing facility  Discussed NSQIP risk outcomes  He wants to proceed with surgery  Discussed that Dr Sheliah Hatch will be following pt starting at 0700  Plan would be to go to surgery once INR improves. Discussed the possibility that her coagulopathy may not be referred and then surgery would not be an option  Mary Sella. Andrey Campanile, MD, FACS General, Bariatric, & Minimally Invasive Surgery [facility], [place]

## 2020-06-23 DEATH — deceased

## 2020-06-24 LAB — CULTURE, BLOOD (ROUTINE X 2)
Culture: NO GROWTH
Culture: NO GROWTH
Special Requests: ADEQUATE
Special Requests: ADEQUATE

## 2020-07-24 NOTE — Death Summary Note (Signed)
DEATH SUMMARY   Patient Details  Name: Cindy PellegriniLillian Louise Hess MRN: [id number] DOB: 1956-03-05  Admission/Discharge Information   Admit Date:  05/31/2020  Date of Death: Date of Death: 06/04/2020  Time of Death: Time of Death: 2053  Length of Stay: 2  Referring Physician: Patient, No Pcp Per   Reason(s) for Hospitalization  Cindy MaxcyLillian Hess is a 65 year old woman, daily smoker with hypertension and severe peripheral artery disease, CAD s/p stenting and hyperlipidemia who presented on 2/23 with severe pain and weakness in both of her legs. 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  Diagnoses  Preliminary cause of death: Ischemic necrosis of small bowel (HCC) Secondary Diagnoses (including complications and co-morbidities):  Principal Problem:   Hypertensive urgency Active Problems:   Nicotine dependence, cigarettes, uncomplicated   Coronary artery disease involving native coronary artery of native heart without angina pectoris   Mixed hyperlipidemia   Iliac artery occlusion, bilateral (HCC)   GERD without esophagitis   Ischemic leg pain   Intractable vomiting   Brief Hospital Course (including significant findings, care, treatment, and services provided and events leading to death)  Cindy PellegriniLillian Louise Hess is a 65 y.o. year old female who year old woman, daily smoker with hypertension and severe peripheral artery disease, CAD s/p stenting and hyperlipidemia who presented on 2/23 with severe pain and weakness in both of her legs. 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  On 2/24 in the  evening she had a rapid response called as she was found unresponsive. This was thought to be secondary to narcotic pain medication as she initially responded to narcan. She then had a second unresponsive episode which again responded to narcan and fluid administration. Labs revealed a serum bicarb of 10 and an initial blood gas with pH 7.2, pCO 22, pO2 344, HCO3 9.9 and a follow up gas with pH 7.18, pCO2 <19, pO2 429 and HCO3 6.9. Lactic acid returned at >11. Hemoglobin had trended down to 11g/dL from 14 on admission. CT abdomen pelvis was done to look for retroperitoneal bleed but discovered air throughout the mesenteric vessels consistent with mesenteric ischemia.   Based on concerning trajectory of patient she was transferred to the ICU for further care on 2/25.   Past Medical History:  CAD PVD Tobacco Use HTN HLD  Significant Hospital Events:  2/24 - rapid response  Consults:  Interventional Radiology Vascular Surgery  Procedures:    Significant Diagnostic Tests:  CT abdomen 2/25 1. High-grade distal small bowel obstruction. 2. Portal venous air within the right lobe of the liver with a moderate amount of air noted throughout the mesenteric vessels, consistent with sequelae associated with mesenteric ischemia. 3. Mild right lower lobe infiltrate. 4. Bilateral external iliac artery stents. 5. Aortic atherosclerosis.  Micro Data:  Respiratory panel 2/22 > negative MRSA PCR 2/23 > negative Blood cultures 2/25 >   Antimicrobials:  Flagyl 2/25   Hospital course:  Patient had acute on chronic mesenteric ischemia with significant small bowel obstruction.  Developed worsening kidney injury severe metabolic acidosis lactic acidosis.  The patient was felt to  be not an operable candidate by vascular surgery or general surgery.  After further discussion with patient's family and the fact that she was not a operable candidate for the ischemic bowel decision was made for  comfort care withdrawal.  Please see documentation by Raymon Mutton, NP regarding comfort care decision.  We appreciate palliative care input.  Patient passed peacefully in the ICU with family at bedside.  Pertinent Labs and Studies  Significant Diagnostic Studies CT ABDOMEN PELVIS WO CONTRAST  Addendum Date: 05/28/2020   ADDENDUM REPORT: 06/10/2020 03:31 ADDENDUM: Results were discussed with Dr. Rachael Darby at 3:29 a.m. [place] on June 19, 2020. Electronically Signed   By: Aram Candela M.D.   On: 06/06/2020 03:31   Result Date: 06/15/2020 CLINICAL DATA:  Rule out retroperitoneal bleeding. EXAM: CT ABDOMEN AND PELVIS WITHOUT CONTRAST TECHNIQUE: Multidetector CT imaging of the abdomen and pelvis was performed following the standard protocol without IV contrast. COMPARISON:  August 16, 2018 FINDINGS: Lower chest: Mild areas of patchy infiltrate are seen within the posteromedial aspect of the right lower lobe. Mild posterior left basilar scarring and/or atelectasis is also noted. Hepatobiliary: A small amount of air is seen within the anterior aspect of the right lobe of the liver. Contrast attenuation is seen throughout the lumen of the gallbladder. Pancreas: Unremarkable. No pancreatic ductal dilatation or surrounding inflammatory changes. Spleen: Normal in size without focal abnormality. Adrenals/Urinary Tract: Adrenal glands are unremarkable. Kidneys are normal, without renal calculi, focal lesion, or hydronephrosis. The urinary bladder is poorly visualized. Stomach/Bowel: There is moderate severity gastric distension. The appendix is not visualized. Multiple dilated small bowel loops are seen throughout the abdomen and pelvis (maximum small bowel diameter of approximately 4.2 cm) air is also seen within the wall of multiple distal small bowel loops. Vascular/Lymphatic: Marked severity aortic atherosclerosis. Bilateral external iliac artery stents are in place. No enlarged abdominal or pelvic lymph  nodes. Reproductive: Uterus and bilateral adnexa are unremarkable. Other: No abdominal wall hernia or abnormality. Air is seen within multiple branching mesenteric vessels (axial CT images 41 through 65, CT series number 3). No abdominopelvic ascites. Musculoskeletal: No acute or significant osseous findings. IMPRESSION: 1. High-grade distal small bowel obstruction. 2. Portal venous air within the right lobe of the liver with a moderate amount of air noted throughout the mesenteric vessels, consistent with sequelae associated with mesenteric ischemia. 3. Mild right lower lobe infiltrate. 4. Bilateral external iliac artery stents. 5. Aortic atherosclerosis. Aortic Atherosclerosis (ICD10-I70.0). Electronically Signed: By: Aram Candela M.D. On: 06/03/2020 03:27   DG Chest 1 View  Result Date: 05/31/2020 CLINICAL DATA:  Shortness of breath and weakness. EXAM: CHEST  1 VIEW COMPARISON:  June 17, 2020 FINDINGS: The lungs are hyperinflated. There is no evidence of acute infiltrate, pleural effusion or pneumothorax. The heart size and mediastinal contours are within normal limits. The visualized skeletal structures are unremarkable. IMPRESSION: No active disease. Electronically Signed   By: Aram Candela M.D.   On: 06/05/2020 01:18   DG Chest 1 View  Result Date: 06/17/2020 CLINICAL DATA:  Chest pain, shortness of breath, elevated blood pressure EXAM: CHEST  1 VIEW COMPARISON:  Radiograph Jun 22, 2020 FINDINGS: Mild hyperinflation similar to comparison exam. No consolidation, features of edema, pneumothorax, or effusion. Pulmonary vascularity is normally distributed. The cardiomediastinal contours are unremarkable. No acute osseous or soft tissue abnormality. The cardiomediastinal contours are unremarkable. No acute osseous or soft tissue abnormality. Degenerative changes are present in the imaged spine and shoulders. Telemetry leads overlie the chest.  IMPRESSION: No acute cardiopulmonary disease. Chronic  hyperinflation. Electronically Signed   By: Kreg Shropshire M.D.   On: 06/17/2020 22:17   DG Chest 2 View  Result Date: 06/07/2020 CLINICAL DATA:  65 year old female with generalized weakness and back pain EXAM: CHEST - 2 VIEW COMPARISON:  None. FINDINGS: The heart size and mediastinal contours are within normal limits. Nipple shadows overlie the bilateral lungs. No focal consolidation. No pleural effusion. No pneumothorax. The visualized skeletal structures are unremarkable. IMPRESSION: No active cardiopulmonary disease. Electronically Signed   By: Maudry Mayhew MD   On: 06/08/2020 11:47   CT Head Wo Contrast  Result Date: 06/05/2020 CLINICAL DATA:  65 year old female with weakness, back pain, mental status change. Hypertensive and tachycardic at presentation. EXAM: CT HEAD WITHOUT CONTRAST TECHNIQUE: Contiguous axial images were obtained from the base of the skull through the vertex without intravenous contrast. COMPARISON:  None. FINDINGS: Brain: Confluent dystrophic calcification in the bilateral basal ganglia, deep cerebellar nuclei. No midline shift, ventriculomegaly, mass effect, evidence of mass lesion, intracranial hemorrhage or evidence of cortically based acute infarction. Mild for age patchy white matter hypodensity in the superior hemispheres. No cortical encephalomalacia identified. Vascular: Calcified atherosclerosis at the skull base. No suspicious intracranial vascular hyperdensity. Skull: Negative. Sinuses/Orbits: Visualized paranasal sinuses and mastoids are clear. Other: No acute orbit or scalp soft tissue finding. IMPRESSION: 1. No acute intracranial abnormality. 2. 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 3. Mild for age nonspecific white matter changes. Electronically Signed   By: Odessa Fleming M.D.   On: 05/26/2020 11:55   CT Angio Aortobifemoral W and/or Wo Contrast  Result Date: 06/09/2020 CLINICAL DATA:  65 year old  female with generalized weakness and back pain. EXAM: CT ANGIOGRAPHY OF ABDOMINAL AORTA WITH ILIOFEMORAL RUNOFF TECHNIQUE: Multidetector CT imaging of the abdomen, pelvis and lower extremities was performed using the standard protocol during bolus administration of intravenous contrast. Multiplanar CT image reconstructions and MIPs were obtained to evaluate the vascular anatomy. CONTRAST:  OMNIPAQUE IOHEXOL 350 MG/ML SOLN COMPARISON:  08/16/2018 FINDINGS: VASCULAR Aorta: Normal caliber. Severe fibrofatty and calcific atherosclerotic changes, most prominent in the distal aorta where there is near circumferential mural plaque resulting in approximately 75% stenosis in the distal aorta. Celiac: Occluded. There is distal reconstitution via the GDA to supply the hepatic, left gastric, and splenic arteries. SMA: Occluded proximally.  Distal reconstitution. Renals: Moderate ostial stenosis of the single right renal artery secondary to atherosclerotic plaque. Mild stenosis of the proximal single left renal artery secondary to atherosclerotic plaque. IMA: Severe ostial stenosis secondary to atherosclerotic plaque. Patent distally including the arc of [place]iolan. RIGHT Lower Extremity Inflow: Total occlusion of the common iliac artery and indwelling common to external iliac artery stent. Distal reconstitution in the distal external iliac artery. Outflow: Common femoral artery is patent proximally with severe distal stenosis secondary to atherosclerotic plaque. The profunda is patent but diminutive. The superficial femoral artery is diminutive proximally, gradually tapering to occlusion at the level of Hunter's canal read there is prominent atherosclerotic calcification. There is distal reconstitution in the distal P1 segment of the popliteal which is diminutive but patent throughout Runoff: Patent trifurcation proximally, each runoff vessel diminutive distally, limited evaluation. LEFT Lower Extremity Inflow: Severe  multifocal stenoses of the left common iliac artery secondary to near circumferential and irregular atherosclerotic calcifications. The proximal internal iliac artery is occluded with distal reconstitution. The indwelling left common external iliac artery stent is patent. There is  a focal occlusion in the native distal external iliac artery just beyond the distal aspect of the indwelling external iliac artery stent. There is distal reconstitution into the common femoral artery which is diminutive with advanced atherosclerotic calcifications. Outflow: The profunda is patent. The SFA his diminutive throughout with multifocal at least moderate stenosis secondary to atherosclerotic plaque. The popliteal artery is patent but diminutive. Runoff: Patent trifurcation. There is definite inline flow via the anterior tibial artery. The peroneal appears patent to the level of the ankle. The posterior tibial artery tapers proximally without definite inline flow to the foot. Veins: No obvious venous abnormality within the limitations of this arterial phase study. Review of the MIP images confirms the above findings. NON-VASCULAR Lower chest: No acute abnormality. Hepatobiliary: No focal liver abnormality is seen. No gallstones, gallbladder wall thickening, or biliary dilatation. Pancreas: Unremarkable. No pancreatic ductal dilatation or surrounding inflammatory changes. Spleen: Normal in size without focal abnormality. Adrenals/Urinary Tract: Adrenal glands are unremarkable. Kidneys are normal, without renal calculi, focal lesion, or hydronephrosis. Bladder is unremarkable. Stomach/Bowel: Stomach is distended with enteric contents. Appendix is not definitively visualized. Moderate to large volume stool burden. The sigmoid colon is redundant. No evidence of bowel wall thickening, distention, or inflammatory changes. Lymphatic: No abdominopelvic lymphadenopathy. Reproductive: Uterus and bilateral adnexa are unremarkable. Other:  Tiny fat containing umbilical hernia.  No ascites. Musculoskeletal: No acute or significant osseous findings. IMPRESSION: VASCULAR 1. Total occlusion of the common iliac artery and indwelling common to external iliac artery stent. Distal reconstitution in the distal external iliac artery. Short segment chronic occlusion of the distal right superficial artery with reconstitution in the proximal popliteal artery. Patent right trifurcation proximally, limited evaluation of the runoff vessels distally. 2. 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 There is at least moderate stenosis of the native proximal left common iliac artery. The indwelling common external iliac artery stent is patent. Focal occlusion about the native distal left external iliac artery, just distal to the indwelling stent. Diminutive left SFA with at least moderate multifocal stenoses. Patent left trifurcation with inline flow to the foot via the anterior tibial artery. 3. Severe ostial stenosis of the inferior mesenteric artery secondary to atherosclerotic plaque. Unchanged chronic ostial occlusions of the celiac and superior mesenteric arteries. Patent arc of Riolan. NON-VASCULAR 1. Distended stomach with enteric contents. 2. Moderate to large stool burden. Marliss Coots, MD [facility] Radiology Electronically Signed   By: Marliss Coots MD   On: 07/03/20 14:36   DG CHEST PORT 1 VIEW  Result Date: 06/03/2020 CLINICAL DATA:  Central line and nasogastric tube placement. EXAM: PORTABLE CHEST 1 VIEW COMPARISON:  06/17/2020 FINDINGS: RIGHT IJ central line has been placed, tip overlying the level of superior vena cava. Nasogastric tube is in place, tip beyond the image and beyond the level of the mid stomach. There is gaseous distension of the stomach. Heart size is normal.  Lungs are clear.  No edema.  No pneumothorax.  IMPRESSION: Interval placement of RIGHT IJ central line. No pneumothorax. Electronically Signed   By: Norva Pavlov M.D.   On: 06/05/2020 08:18    Microbiology No results found for this or any previous visit (from the past 240 hour(s)).  Lab Basic Metabolic Panel: No results for input(s): NA, K, CL, CO2, GLUCOSE, BUN, CREATININE, CALCIUM, MG, PHOS in the last 168 hours. Liver Function Tests: No results for input(s): AST, ALT, ALKPHOS, BILITOT,  PROT, ALBUMIN in the last 168 hours. No results for input(s): LIPASE, AMYLASE in the last 168 hours. No results for input(s): AMMONIA in the last 168 hours. CBC: No results for input(s): WBC, NEUTROABS, HGB, HCT, MCV, PLT in the last 168 hours. Cardiac Enzymes: No results for input(s): CKTOTAL, CKMB, CKMBINDEX, TROPONINI in the last 168 hours. Sepsis Labs: No results for input(s): PROCALCITON, WBC, LATICACIDVEN in the last 168 hours.  Procedures/Operations     Welles Walthall L Kyri Dai 06/30/2020, 11:00 AM

## 2022-05-03 IMAGING — DX DG CHEST 1V
1 series · 1 of 1 positions shown · non-contrast
Comparison: Radiograph 06/16/2020

CLINICAL DATA: Chest pain, shortness of breath, elevated blood
pressure

EXAM:
CHEST  1 VIEW

[chest ap]
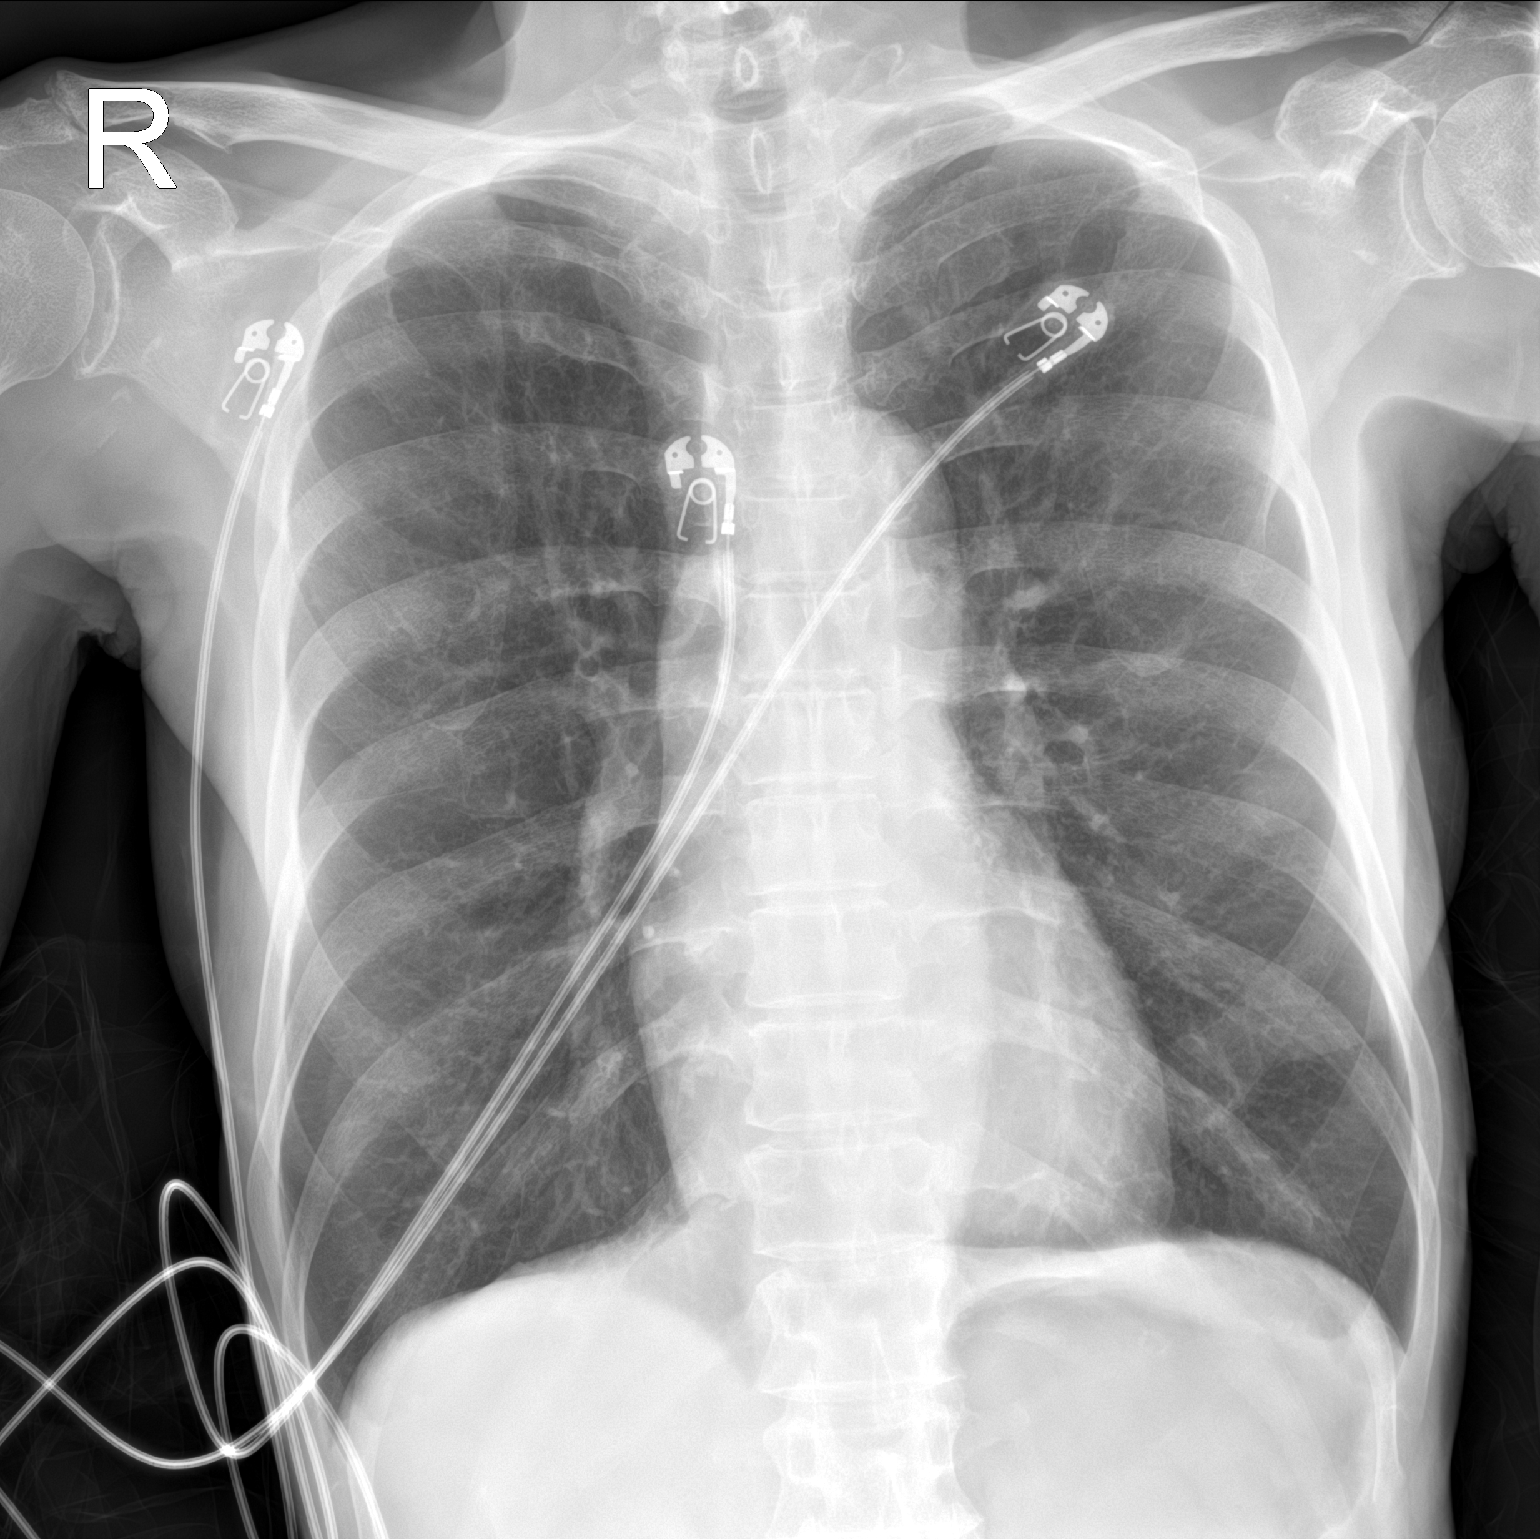

[1 of 1 positions shown; findings below may reference images not displayed]

FINDINGS: Mild hyperinflation similar to comparison exam. No consolidation,
features of edema, pneumothorax, or effusion. Pulmonary vascularity
is normally distributed. The cardiomediastinal contours are
unremarkable. No acute osseous or soft tissue abnormality. The
cardiomediastinal contours are unremarkable. No acute osseous or
soft tissue abnormality. Degenerative changes are present in the
imaged spine and shoulders. Telemetry leads overlie the chest.
IMPRESSION: No acute cardiopulmonary disease.

Chronic hyperinflation.

## 2022-05-05 IMAGING — CT CT ABD-PELV W/O CM
2 of 4 series · 14 of 46 positions shown, 16 images · non-contrast
Comparison: August 16, 2018
COMPARISON: August 16, 2018

Addendum:
CLINICAL DATA: Rule out retroperitoneal bleeding.

EXAM:
CT ABDOMEN AND PELVIS WITHOUT CONTRAST
TECHNIQUE: Multidetector CT imaging of the abdomen and pelvis was performed
following the standard protocol without IV contrast.

[Series 3: ap without · axial · non-contrast · 0.75mm/px · z∈[+927,+1337]mm · 11 of 94 slices shown, 13 images]
[im 6/94  soft-tissue]
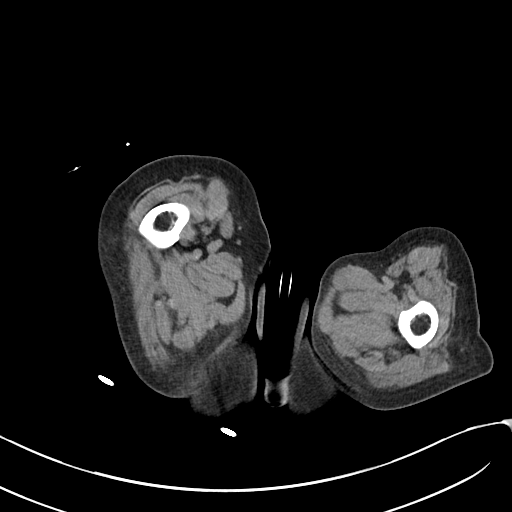
[im 6/94  bone]
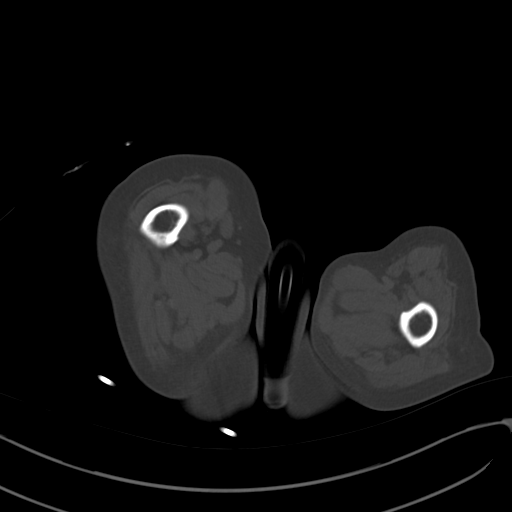
[im 17/94  soft-tissue]
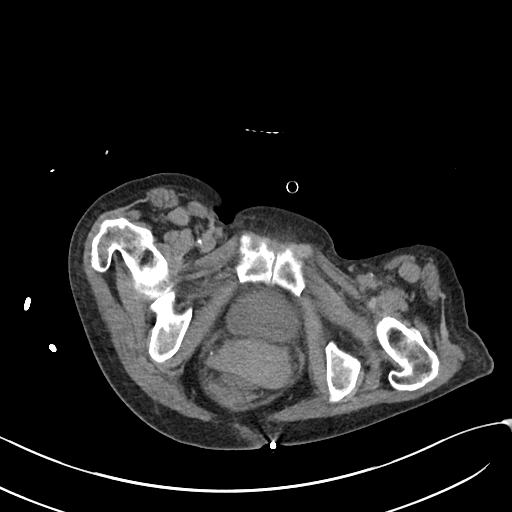
[im 22/94  soft-tissue]
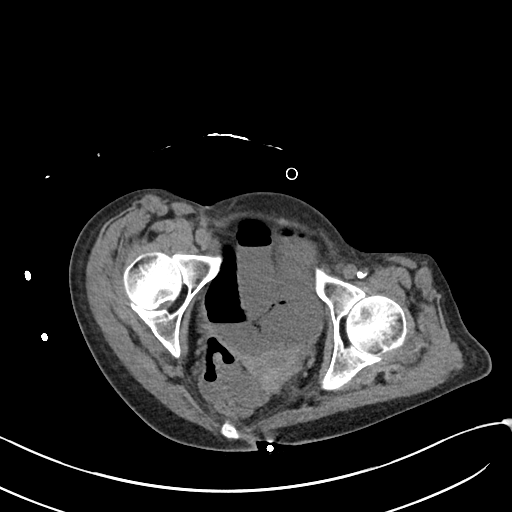
[im 33/94  soft-tissue]
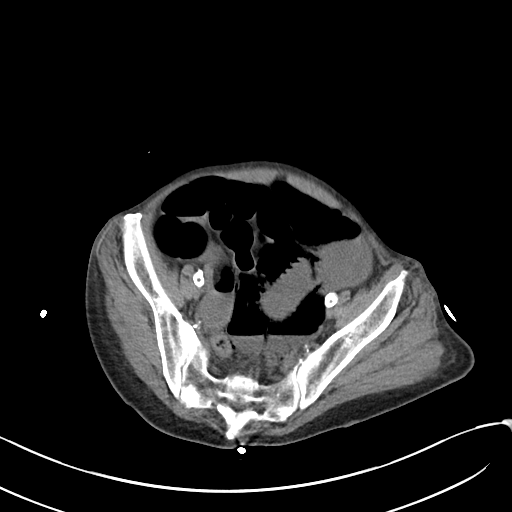
[im 39/94  soft-tissue]
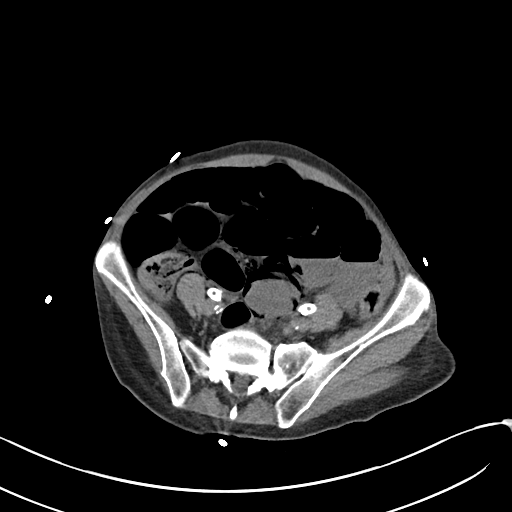
[im 50/94  soft-tissue]
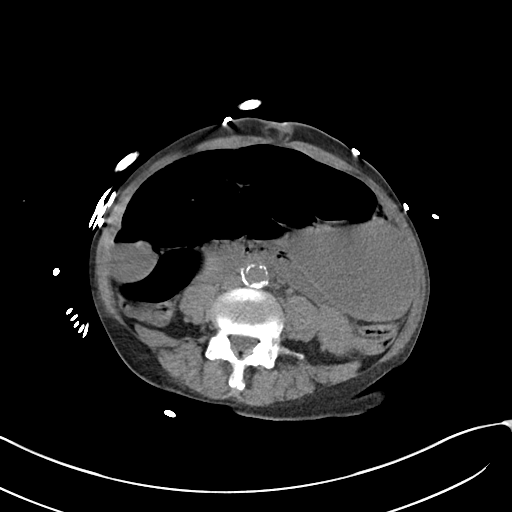
[im 55/94  soft-tissue]
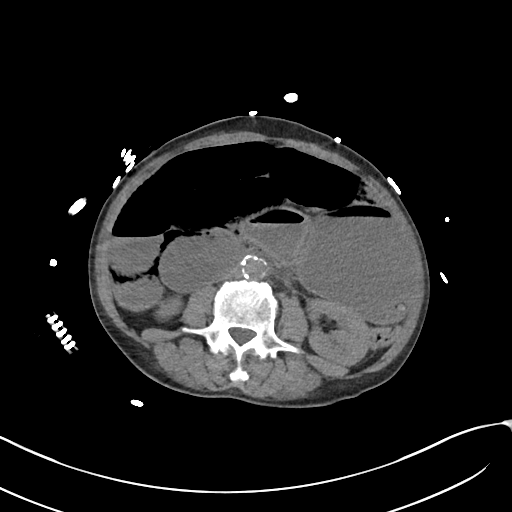
[im 61/94  soft-tissue]
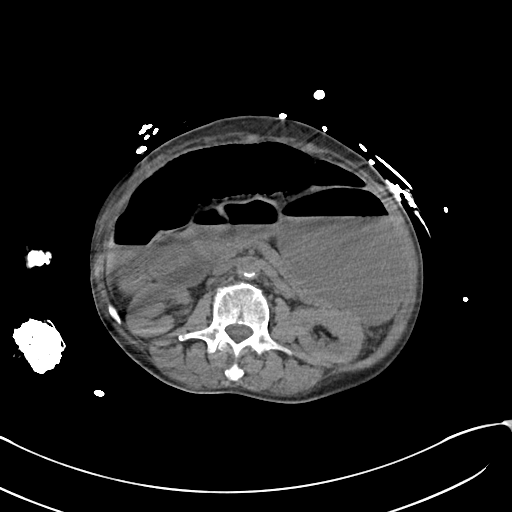
[im 72/94  soft-tissue]
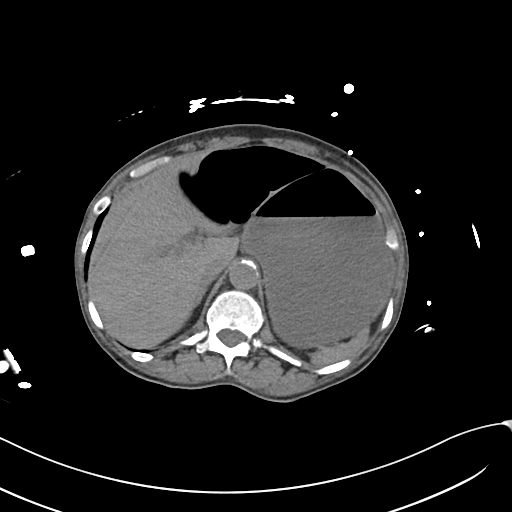
[im 72/94  bone]
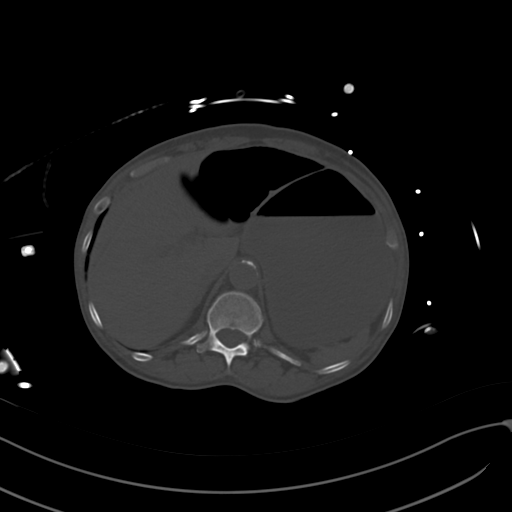
[im 77/94  soft-tissue]
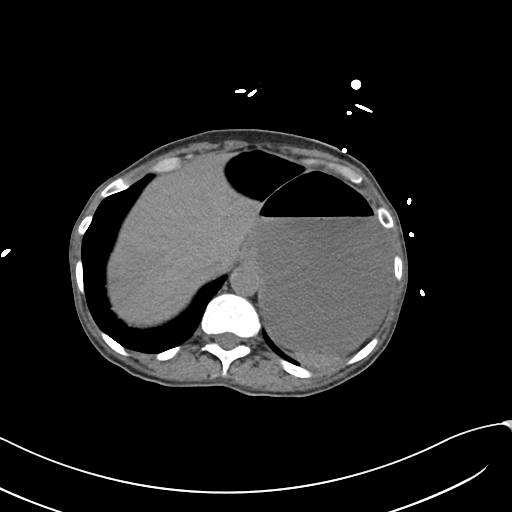
[im 88/94  soft-tissue]
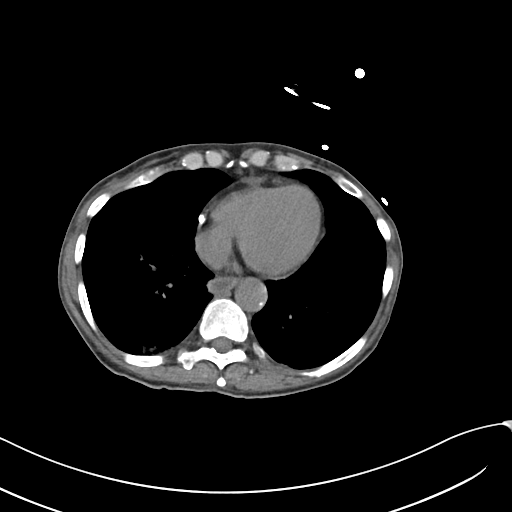

[Series 6: cor · coronal · 0.72mm/px · 3 of 88 slices shown]
[im 30/88  soft-tissue]
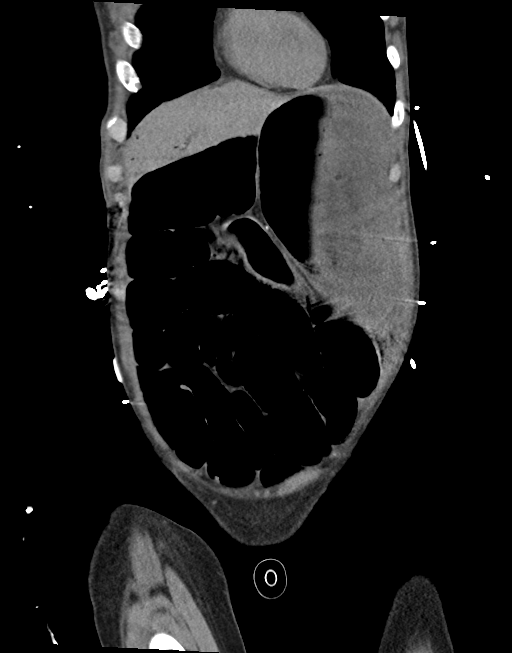
[im 39/88  soft-tissue]
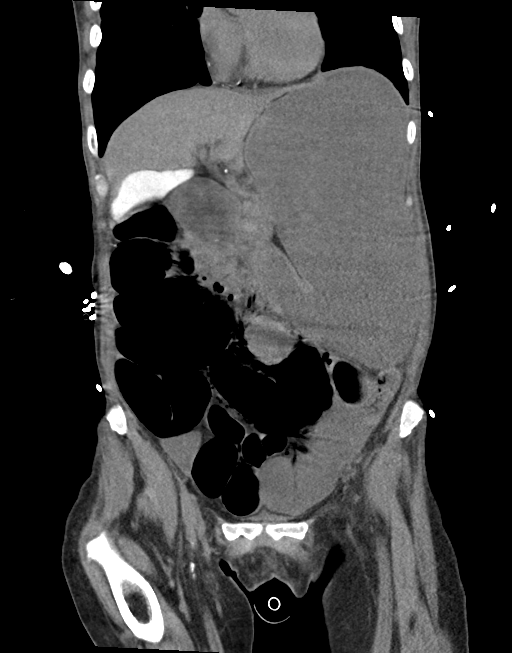
[im 49/88  soft-tissue]
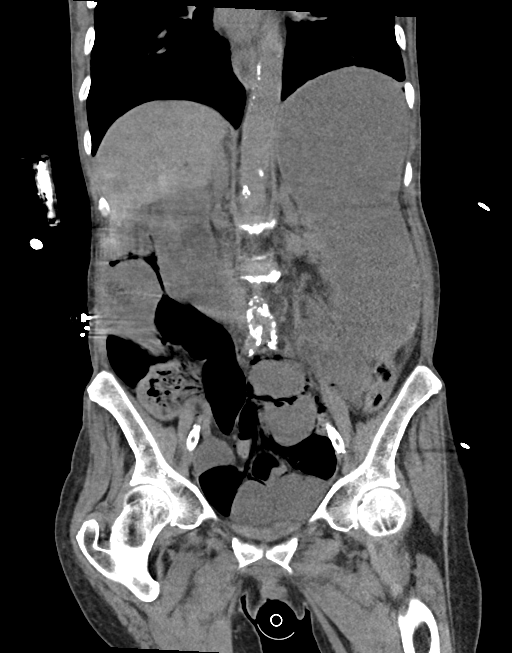

[14 of 46 positions shown; findings below may reference images not displayed]

FINDINGS: Lower chest: Mild areas of patchy infiltrate are seen within the
posteromedial aspect of the right lower lobe.

Mild posterior left basilar scarring and/or atelectasis is also
noted.

Hepatobiliary: A small amount of air is seen within the anterior
aspect of the right lobe of the liver. Contrast attenuation is seen
throughout the lumen of the gallbladder.

Pancreas: Unremarkable. No pancreatic ductal dilatation or
surrounding inflammatory changes.

Spleen: Normal in size without focal abnormality.

Adrenals/Urinary Tract: Adrenal glands are unremarkable. Kidneys are
normal, without renal calculi, focal lesion, or hydronephrosis. The
urinary bladder is poorly visualized.

Stomach/Bowel: There is moderate severity gastric distension. The
appendix is not visualized. Multiple dilated small bowel loops are
seen throughout the abdomen and pelvis (maximum small bowel diameter
of approximately 4.2 cm) air is also seen within the wall of
multiple distal small bowel loops.

Vascular/Lymphatic: Marked severity aortic atherosclerosis.
Bilateral external iliac artery stents are in place. No enlarged
abdominal or pelvic lymph nodes.

Reproductive: Uterus and bilateral adnexa are unremarkable.

Other: No abdominal wall hernia or abnormality.

Air is seen within multiple branching mesenteric vessels (axial CT
images 41 through 65, CT series number 3).

No abdominopelvic ascites.

Musculoskeletal: No acute or significant osseous findings.
IMPRESSION: 1. High-grade distal small bowel obstruction.
2. Portal venous air within the right lobe of the liver with a
moderate amount of air noted throughout the mesenteric vessels,
consistent with sequelae associated with mesenteric ischemia.
3. Mild right lower lobe infiltrate.
4. Bilateral external iliac artery stents.
5. Aortic atherosclerosis.

Aortic Atherosclerosis (6H42L-6KY.Y).

ADDENDUM:
Results were discussed with Dr. Deshawn at [DATE] a.m. [place] on
June 19, 2020.

*** End of Addendum ***
FINDINGS: Lower chest: Mild areas of patchy infiltrate are seen within the
posteromedial aspect of the right lower lobe.

Mild posterior left basilar scarring and/or atelectasis is also
noted.

Hepatobiliary: A small amount of air is seen within the anterior
aspect of the right lobe of the liver. Contrast attenuation is seen
throughout the lumen of the gallbladder.

Pancreas: Unremarkable. No pancreatic ductal dilatation or
surrounding inflammatory changes.

Spleen: Normal in size without focal abnormality.

Adrenals/Urinary Tract: Adrenal glands are unremarkable. Kidneys are
normal, without renal calculi, focal lesion, or hydronephrosis. The
urinary bladder is poorly visualized.

Stomach/Bowel: There is moderate severity gastric distension. The
appendix is not visualized. Multiple dilated small bowel loops are
seen throughout the abdomen and pelvis (maximum small bowel diameter
of approximately 4.2 cm) air is also seen within the wall of
multiple distal small bowel loops.

Vascular/Lymphatic: Marked severity aortic atherosclerosis.
Bilateral external iliac artery stents are in place. No enlarged
abdominal or pelvic lymph nodes.

Reproductive: Uterus and bilateral adnexa are unremarkable.

Other: No abdominal wall hernia or abnormality.

Air is seen within multiple branching mesenteric vessels (axial CT
images 41 through 65, CT series number 3).

No abdominopelvic ascites.

Musculoskeletal: No acute or significant osseous findings.
IMPRESSION: 1. High-grade distal small bowel obstruction.
2. Portal venous air within the right lobe of the liver with a
moderate amount of air noted throughout the mesenteric vessels,
consistent with sequelae associated with mesenteric ischemia.
3. Mild right lower lobe infiltrate.
4. Bilateral external iliac artery stents.
5. Aortic atherosclerosis.

Aortic Atherosclerosis (6H42L-6KY.Y).

## 2022-05-05 IMAGING — DX DG CHEST 1V
1 series · 1 of 1 positions shown · non-contrast
Comparison: June 17, 2020

CLINICAL DATA: Shortness of breath and weakness.

EXAM:
CHEST  1 VIEW

[chest ap]
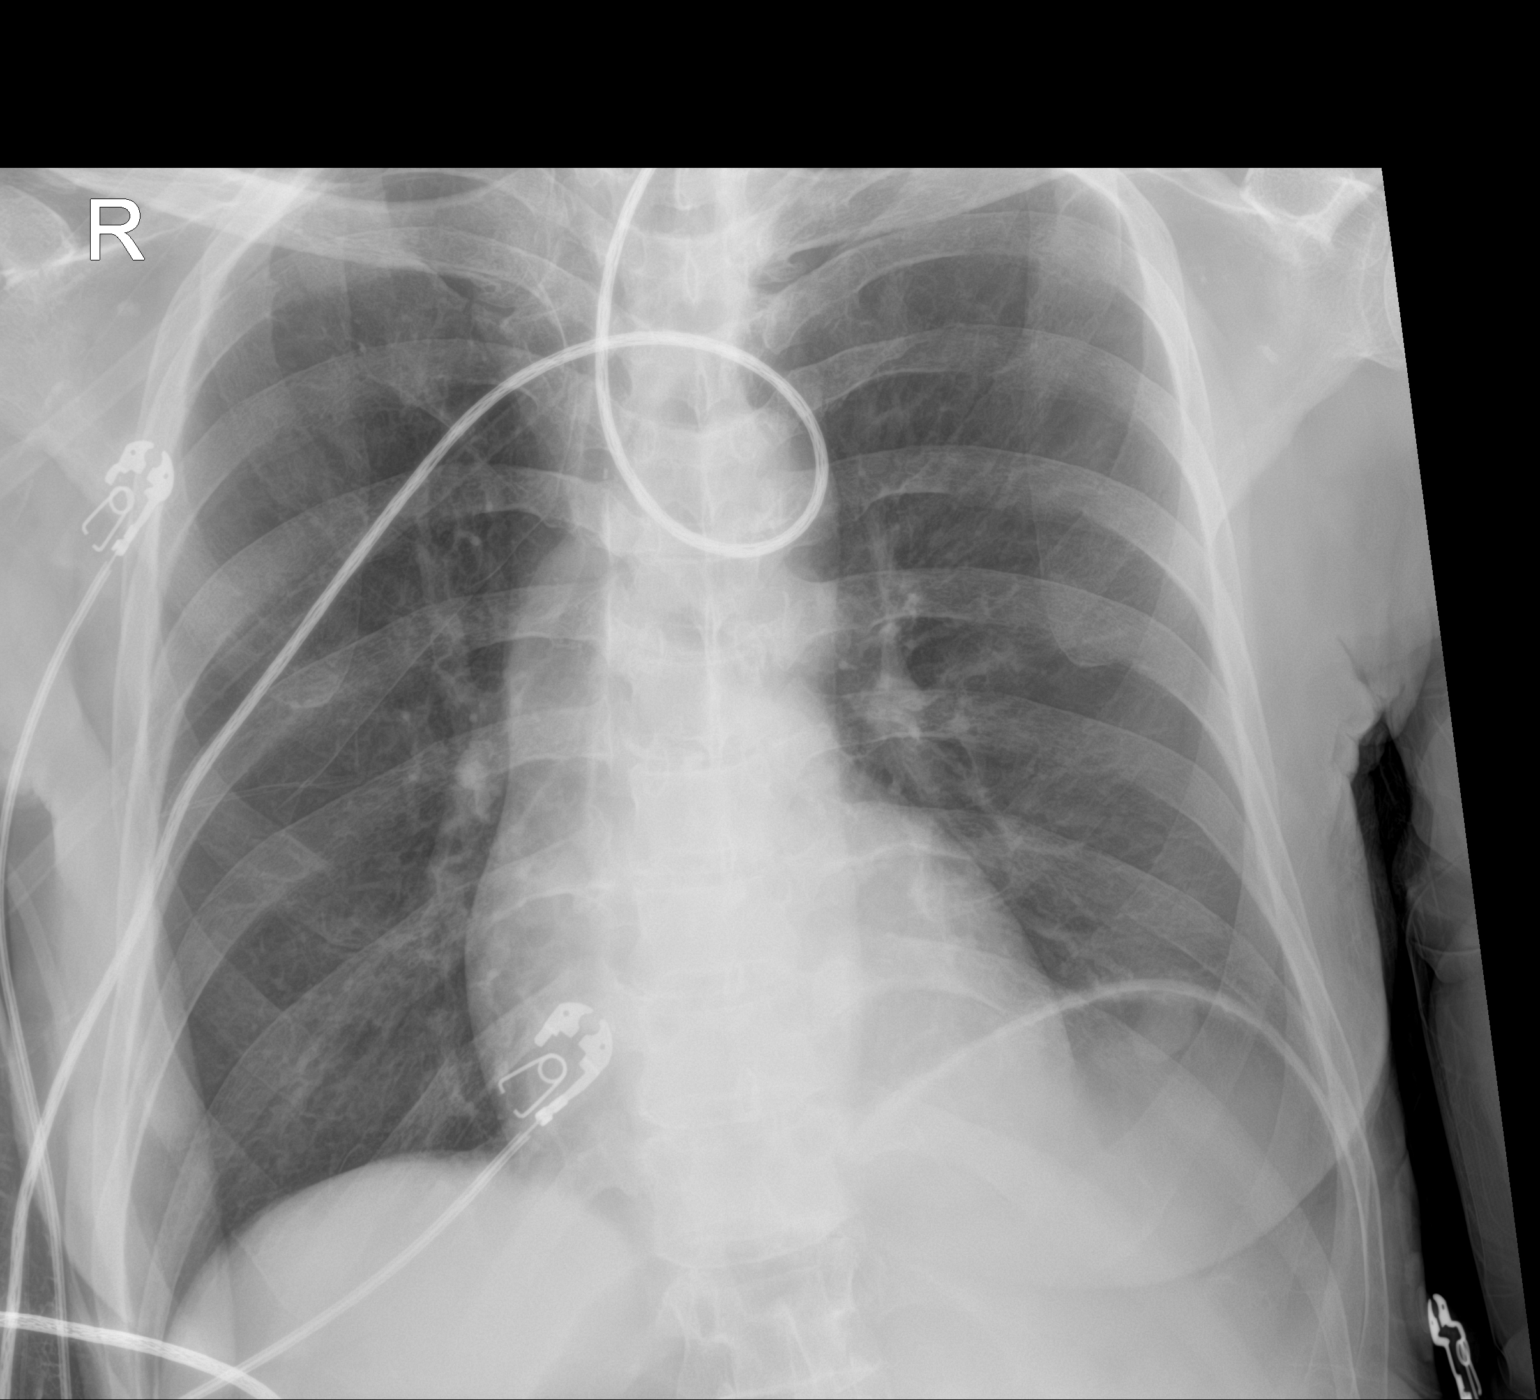

[1 of 1 positions shown; findings below may reference images not displayed]

FINDINGS: The lungs are hyperinflated. There is no evidence of acute
infiltrate, pleural effusion or pneumothorax. The heart size and
mediastinal contours are within normal limits. The visualized
skeletal structures are unremarkable.
IMPRESSION: No active disease.
